# Patient Record
Sex: Female | Born: 1970 | Race: White | Hispanic: No | Marital: Married | Smoking: Former smoker
Health system: Southern US, Community
[De-identification: ages and names within clinical notes are randomized; demographics above are authoritative.]

## PROBLEM LIST (undated history)

## (undated) DIAGNOSIS — J449 Chronic obstructive pulmonary disease, unspecified: Secondary | ICD-10-CM

## (undated) HISTORY — DX: Chronic obstructive pulmonary disease, unspecified: J44.9

## (undated) HISTORY — PX: HERNIA REPAIR: SHX51

---

## 1993-02-19 HISTORY — PX: BREAST EXCISIONAL BIOPSY: SUR124

## 2004-11-09 ENCOUNTER — Inpatient Hospital Stay: Payer: Self-pay | Admitting: Unknown Physician Specialty

## 2005-05-22 ENCOUNTER — Ambulatory Visit: Payer: Self-pay | Admitting: General Surgery

## 2006-04-16 ENCOUNTER — Ambulatory Visit: Payer: Self-pay

## 2006-07-23 ENCOUNTER — Inpatient Hospital Stay: Payer: Self-pay

## 2006-11-19 ENCOUNTER — Ambulatory Visit: Payer: Self-pay | Admitting: Internal Medicine

## 2008-08-09 ENCOUNTER — Ambulatory Visit: Payer: Self-pay | Admitting: General Surgery

## 2008-08-13 ENCOUNTER — Ambulatory Visit: Payer: Self-pay | Admitting: General Surgery

## 2009-06-21 ENCOUNTER — Ambulatory Visit: Payer: Self-pay | Admitting: Internal Medicine

## 2010-10-17 ENCOUNTER — Ambulatory Visit: Payer: Self-pay | Admitting: Obstetrics and Gynecology

## 2011-01-22 ENCOUNTER — Telehealth: Payer: Self-pay | Admitting: *Deleted

## 2011-01-22 NOTE — Telephone Encounter (Signed)
Pls confirm that this patient has been haven an appt since a note was generated. When she gets here.  Sh e will need an EKG

## 2011-01-22 NOTE — Telephone Encounter (Signed)
Yes, patient is aware of the appt.

## 2011-01-22 NOTE — Telephone Encounter (Signed)
I spoke w/pt - she c/o left sided chest pressure and ache, both mild. Area of pressure & discomfort are not external. Symptoms have been off and on x 4 wks but now more consistent x 1 wk. No nausea, SOB, left arm or jaw pain. Symptoms are NOT worse w/exertion. She reports no history of of heart disease/problems only history of anxiety. She is getting concerned b/c klonopin given for anxiety has not helped w/symptoms.   Dr Darrick Huntsman - would you consider working patient in soon for eval? (she was advised to go to ER w/change in symptoms)

## 2011-01-23 ENCOUNTER — Ambulatory Visit (INDEPENDENT_AMBULATORY_CARE_PROVIDER_SITE_OTHER): Payer: BC Managed Care – PPO | Admitting: Internal Medicine

## 2011-01-23 ENCOUNTER — Ambulatory Visit: Payer: Self-pay | Admitting: Internal Medicine

## 2011-01-23 ENCOUNTER — Encounter: Payer: Self-pay | Admitting: Internal Medicine

## 2011-01-23 DIAGNOSIS — R079 Chest pain, unspecified: Secondary | ICD-10-CM

## 2011-01-23 MED ORDER — HYOSCYAMINE SULFATE 0.125 MG SL SUBL: 0.1250 mg | SUBLINGUAL_TABLET | SUBLINGUAL | Status: AC | PRN

## 2011-01-23 MED ORDER — ESOMEPRAZOLE MAGNESIUM 40 MG PO CPDR: 40.0000 mg | DELAYED_RELEASE_CAPSULE | Freq: Every day | ORAL | Status: DC

## 2011-01-23 MED ORDER — ALPRAZOLAM 0.5 MG PO TABS: 0.5000 mg | ORAL_TABLET | Freq: Three times a day (TID) | ORAL | Status: AC | PRN

## 2011-01-23 NOTE — Patient Instructions (Signed)
We are going to treat your for anxiety and esophagitis with several medications:  For the esophagus we will start Nexium on tablet daily (replaces zantac)  For the next episode of chest tightness,  dissolve a hyoscyamine under your tongue, and if no relief in 15 minutes,  Take an alprazolam ( for panic,  Quicker acting than clonazepam)  Let's get a chest x ray tonight  If symptoms are not at all improved with medications,  I recommend getting a  Holter monitor to check your heart rhythm

## 2011-01-23 NOTE — Progress Notes (Signed)
  Subjective:    Patient ID: Virginia Maldonado, female    DOB: 01-13-1971, 40 y.o.   MRN: [id number]  HPI  40 yo healthy female with a history of GERDpresents with a one month history of left sided chest pain which has been increasing in frequency over the past week.  She takes zantac once daily , lately bid, and has been having episodes of tachycardia and presyncope lasting several minutes which have occured both at rest and with minimal activity.  Past Medical History  Diagnosis Date  . COPD (chronic obstructive pulmonary disease)     No current outpatient prescriptions on file prior to visit.   Review of Systems  Constitutional: Negative for fever, chills and unexpected weight change.  HENT: Negative for hearing loss, ear pain, nosebleeds, congestion, sore throat, facial swelling, rhinorrhea, sneezing, mouth sores, trouble swallowing, neck pain, neck stiffness, voice change, postnasal drip, sinus pressure, tinnitus and ear discharge.   Eyes: Negative for pain, discharge, redness and visual disturbance.  Respiratory: Negative for cough, chest tightness, shortness of breath, wheezing and stridor.   Cardiovascular: Negative for chest pain, palpitations and leg swelling.  Musculoskeletal: Negative for myalgias and arthralgias.  Skin: Negative for color change and rash.  Neurological: Negative for dizziness, weakness, light-headedness and headaches.  Hematological: Negative for adenopathy.   BP 98/62  Pulse 66  Temp(Src) 98.4 F (36.9 C) (Oral)  Resp 14  Ht 5\' 6"  (1.676 m)  Wt 130 lb (58.968 kg)  BMI 20.98 kg/m2  SpO2 100%  LMP 01/11/2011     Objective:   Physical Exam  Constitutional: She is oriented to person, place, and time. She appears well-developed and well-nourished.  HENT:  Mouth/Throat: Oropharynx is clear and moist.  Eyes: EOM are normal. Pupils are equal, round, and reactive to light. No scleral icterus.  Neck: Normal range of motion. Neck supple. No JVD present. No  thyromegaly present.  Cardiovascular: Normal rate, regular rhythm, normal heart sounds and intact distal pulses.   Pulmonary/Chest: Effort normal and breath sounds normal.  Abdominal: Soft. Bowel sounds are normal. She exhibits no mass. There is no tenderness.  Musculoskeletal: Normal range of motion. She exhibits no edema.  Lymphadenopathy:    She has no cervical adenopathy.  Neurological: She is alert and oriented to person, place, and time.  Skin: Skin is warm and dry.  Psychiatric: She has a normal mood and affect.       Assessment & Plan:  Chest pain:  Given the concurrent tachycardia and presyncope, normal EKG, etiology may be panic attack, SVT, or reflux.  Will treat for reflux and anxiety and if no improvement refer to [facility] for Holter monitor

## 2011-01-25 ENCOUNTER — Telehealth: Payer: Self-pay | Admitting: Internal Medicine

## 2011-01-25 NOTE — Telephone Encounter (Signed)
Her chest x ray is normal. Has she had any relief using the medications I prescribed?

## 2011-01-26 NOTE — Telephone Encounter (Signed)
Left message asking patient to return my call.

## 2011-01-26 NOTE — Telephone Encounter (Signed)
Patient returned call and was notified. She says that she is getting some relief, but not 100%. She says that she will call us back if not feeling completely better in a couple of weeks.

## 2011-02-14 ENCOUNTER — Encounter: Payer: Self-pay | Admitting: Internal Medicine

## 2011-02-27 ENCOUNTER — Telehealth: Payer: Self-pay | Admitting: Internal Medicine

## 2011-02-27 DIAGNOSIS — K219 Gastro-esophageal reflux disease without esophagitis: Secondary | ICD-10-CM

## 2011-02-27 MED ORDER — PANTOPRAZOLE SODIUM 40 MG PO TBEC: 40.0000 mg | DELAYED_RELEASE_TABLET | Freq: Every day | ORAL | Status: DC

## 2011-02-27 NOTE — Telephone Encounter (Signed)
Advised pt

## 2011-02-27 NOTE — Telephone Encounter (Signed)
Pt states she had a rash from head to toe while taking nexium- took this for about 2 weeks.  She had a similar reaction when taking prilosec- had whelps then.  She wants to try something else, not zantac because that doesn't help her.  Uses [facility].

## 2011-02-27 NOTE — Telephone Encounter (Signed)
rx for protonix sent to [facility]

## 2011-06-20 ENCOUNTER — Other Ambulatory Visit: Payer: Self-pay | Admitting: Internal Medicine

## 2011-06-20 DIAGNOSIS — K219 Gastro-esophageal reflux disease without esophagitis: Secondary | ICD-10-CM

## 2011-06-20 MED ORDER — PANTOPRAZOLE SODIUM 40 MG PO TBEC: 40.0000 mg | DELAYED_RELEASE_TABLET | Freq: Every day | ORAL | Status: DC

## 2011-07-20 ENCOUNTER — Other Ambulatory Visit: Payer: Self-pay | Admitting: Internal Medicine

## 2011-11-26 ENCOUNTER — Encounter: Payer: Self-pay | Admitting: Internal Medicine

## 2012-08-12 ENCOUNTER — Ambulatory Visit (INDEPENDENT_AMBULATORY_CARE_PROVIDER_SITE_OTHER): Payer: BC Managed Care – PPO | Admitting: Adult Health

## 2012-08-12 ENCOUNTER — Encounter: Payer: Self-pay | Admitting: Adult Health

## 2012-08-12 ENCOUNTER — Ambulatory Visit (INDEPENDENT_AMBULATORY_CARE_PROVIDER_SITE_OTHER)
Admission: RE | Admit: 2012-08-12 | Discharge: 2012-08-12 | Disposition: A | Discharge status: Home / Self Care | Payer: BC Managed Care – PPO | Source: Ambulatory Visit | Attending: Adult Health | Admitting: Adult Health

## 2012-08-12 VITALS — BP 100/72 | HR 60 | Resp 12 | Wt 134.5 lb

## 2012-08-12 DIAGNOSIS — R079 Chest pain, unspecified: Secondary | ICD-10-CM

## 2012-08-12 DIAGNOSIS — R0789 Other chest pain: Secondary | ICD-10-CM | POA: Insufficient documentation

## 2012-08-12 LAB — BASIC METABOLIC PANEL
BUN: 13 mg/dL (ref 6–23)
Calcium: 9.9 mg/dL (ref 8.4–10.5)
GFR: 74.92 mL/min (ref >60.00)
Potassium: 4.4 mEq/L (ref 3.5–5.1)
Sodium: 138 mEq/L (ref 135–145)

## 2012-08-12 LAB — CBC WITH DIFFERENTIAL/PLATELET
Basophils Absolute: 0 10*3/uL (ref 0.0–0.1)
Eosinophils Relative: 2.5 % (ref 0.0–5.0)
HCT: 37.3 % (ref 36.0–46.0)
Lymphocytes Relative: 27 % (ref 12.0–46.0)
Lymphs Abs: 2.1 10*3/uL (ref 0.7–4.0)
Monocytes Relative: 7.7 % (ref 3.0–12.0)
Platelets: 226 10*3/uL (ref 150.0–400.0)
WBC: 7.6 10*3/uL (ref 4.5–10.5)

## 2012-08-12 LAB — TROPONIN I: Troponin I: <0.01 ng/mL (ref <0.06)

## 2012-08-12 LAB — D-DIMER, QUANTITATIVE: D-Dimer, Quant: 0.27 ug/mL-FEU (ref 0.00–0.48)

## 2012-08-12 NOTE — Patient Instructions (Addendum)
  Please have your blood work drawn prior to leaving the office.  Please go to our [facility] for your chest xray.  Once the results are available we will contact you with further instructions.  Take Zantac 150 mg twice daily.

## 2012-08-12 NOTE — Progress Notes (Signed)
  Subjective:    Patient ID: Virginia Maldonado, female    DOB: 1970/06/18, 42 y.o.   MRN: [id number]  HPI  Patient is a pleasant 42 year old female who presents to clinic with complaints of heavy feeling in her chest only on the left side. She describes the pain as constant and dull. She wakes up every night and then stays up for hours secondary to this pain. She reports that mainly she states up because she becomes very anxious thinking about her pain. The pain does not occur with exercise. She reports very good stamina. Does pushups without this bothering her. She feels she is not getting the same amount of breath on the left side. She feels pressure on the left side when inhaling. Note patient was seen approximately 2 years ago with symptoms of chest pain suspected to be anxiety related.  Pt is experiencing increasing anxiety. It has increased to the point that she [place] not [place]. She has been battling anxiety since grad school back in 1998. She has tried Zoloft but she broke out in a rash. She has tried a small amount of klonopin but did not think it helped so she is not taking this. Has a 104 y/o son who was diagnosed with autism. She has been getting multiple calls from school about his behavior. She feels this is causing increased anxiety.  Remote tobacco use for approximately 6 years ~ 1/2 ppd. She also lived with smokers for several years. Pt exposed to second hand.    Review of Systems  Constitutional: Negative.   HENT: Negative.   Respiratory: Positive for chest tightness. Negative for cough, shortness of breath and wheezing.   Cardiovascular: Positive for chest pain. Negative for palpitations and leg swelling.  Gastrointestinal: Negative.   Genitourinary: Negative.   Musculoskeletal: Negative.   Skin: Negative.   Neurological: Negative.   Psychiatric/Behavioral: The patient is nervous/anxious.     BP 100/72  Pulse 60  Resp 12  Wt 134 lb 8 oz (61.009 kg)  BMI 21.72 kg/m2   SpO2 98%  LMP 07/27/2012    Objective:   Physical Exam  Constitutional: She is oriented to person, place, and time. She appears well-developed and well-nourished. No distress.  HENT:  Head: Normocephalic and atraumatic.  Eyes: Conjunctivae are normal. Pupils are equal, round, and reactive to light.  Neck: Normal range of motion. Neck supple.  Cardiovascular: Normal rate, regular rhythm, normal heart sounds and intact distal pulses.  Exam reveals no gallop and no friction rub.   No murmur heard. Pulmonary/Chest: Effort normal and breath sounds normal. No respiratory distress. She has no wheezes. She has no rales.  Abdominal: Soft. Bowel sounds are normal.  Musculoskeletal: Normal range of motion. She exhibits no edema.  Lymphadenopathy:    She has no cervical adenopathy.  Neurological: She is alert and oriented to person, place, and time.  Skin: Skin is warm and dry.  Psychiatric: She has a normal mood and affect. Her behavior is normal. Judgment and thought content normal.       Assessment & Plan:

## 2012-08-12 NOTE — Assessment & Plan Note (Addendum)
Atypical presentation in patient with history of anxiety. EKG without abnormality. Chest x-ray. Check labs: crp, troponin, metabolic panel, CBC and d-dimer. Discussed with patient about suspicion of this being related to her anxiety. If all test results are negative consider starting her on antidepressant to help prevent anxiety. She has been on Zoloft in the past; however, she developed hives from this. We'll try other medication. Note greater than 25 minutes were spent in face-to-face communication with patient in the assessment, education, planning and implementation pertaining to this problem.

## 2012-08-13 ENCOUNTER — Other Ambulatory Visit: Payer: Self-pay | Admitting: Adult Health

## 2012-08-13 DIAGNOSIS — D539 Nutritional anemia, unspecified: Secondary | ICD-10-CM

## 2012-08-19 ENCOUNTER — Other Ambulatory Visit (INDEPENDENT_AMBULATORY_CARE_PROVIDER_SITE_OTHER): Payer: BC Managed Care – PPO

## 2012-08-19 DIAGNOSIS — D539 Nutritional anemia, unspecified: Secondary | ICD-10-CM

## 2012-08-19 LAB — TSH: TSH: 1.81 u[IU]/mL (ref 0.35–5.50)

## 2012-08-19 LAB — FOLATE: Folate: 23.8 ng/mL (ref >5.9)

## 2012-12-25 ENCOUNTER — Other Ambulatory Visit: Payer: Self-pay

## 2013-03-20 ENCOUNTER — Ambulatory Visit (INDEPENDENT_AMBULATORY_CARE_PROVIDER_SITE_OTHER): Payer: BC Managed Care – PPO | Admitting: Internal Medicine

## 2013-03-20 ENCOUNTER — Encounter: Payer: Self-pay | Admitting: Internal Medicine

## 2013-03-20 VITALS — BP 104/70 | HR 80 | Temp 98.1°F | Resp 18 | Wt 142.0 lb

## 2013-03-20 DIAGNOSIS — J069 Acute upper respiratory infection, unspecified: Secondary | ICD-10-CM

## 2013-03-20 DIAGNOSIS — R918 Other nonspecific abnormal finding of lung field: Secondary | ICD-10-CM

## 2013-03-20 DIAGNOSIS — R9389 Abnormal findings on diagnostic imaging of other specified body structures: Secondary | ICD-10-CM

## 2013-03-20 MED ORDER — AMOXICILLIN 500 MG PO CAPS: 500.0000 mg | ORAL_CAPSULE | Freq: Three times a day (TID) | ORAL | Status: DC

## 2013-03-20 MED ORDER — PREDNISONE (PAK) 10 MG PO TABS: ORAL_TABLET | ORAL | Status: DC

## 2013-03-20 MED ORDER — FLUCONAZOLE 150 MG PO TABS: 150.0000 mg | ORAL_TABLET | Freq: Every day | ORAL | Status: DC

## 2013-03-20 MED ORDER — ALBUTEROL SULFATE HFA 108 (90 BASE) MCG/ACT IN AERS: 2.0000 | INHALATION_SPRAY | Freq: Four times a day (QID) | RESPIRATORY_TRACT | Status: DC | PRN

## 2013-03-20 MED ORDER — LORATADINE 10 MG PO TABS: 10.0000 mg | ORAL_TABLET | Freq: Every day | ORAL | Status: DC

## 2013-03-20 NOTE — Progress Notes (Signed)
Pre-visit discussion using our clinic review tool. No additional management support is needed unless otherwise documented below in the visit note.  

## 2013-03-20 NOTE — Progress Notes (Signed)
Patient ID: Virginia Maldonado, female   DOB: 01/25/1971, 43 y.o.   MRN: [id number]   .  Patient Active Problem List   Diagnosis Date Noted  . Acute upper respiratory infections of unspecified site 03/22/2013  . Abnormal finding on chest xray 03/22/2013  . Chest pain 08/12/2012    Subjective:  CC:   Chief Complaint  Patient presents with  . Fatigue  . Headache  . Weight Gain    HPI:   Virginia Maldonado a 43 y.o. female who presents Fatigue since Monday , headache since Monday.  She has been using advil,  and over-the-counter decongestants. She has noted Some nasal drainage on right,  Two kids with Strep at home.    Had a chest x ray in June that showed hyperinflation.  She did not followup for evaluation for possible asthma. She has noted that she does not typically run during her workouts because she has noted excessive dyspnea. She is no history of tobacco abuse but did grow up in the household   Past Medical History  Diagnosis Date  . COPD (chronic obstructive pulmonary disease)     History reviewed. No pertinent past surgical history.     The following portions of the patient's history were reviewed and updated as appropriate: Allergies, current medications, and problem list.    Review of Systems:   12 Pt  review of systems was negative except those addressed in the HPI,     History   Social History  . Marital Status: Married    Spouse Name: N/A    Number of Children: N/A  . Years of Education: N/A   Occupational History  . Not on file.   Social History Main Topics  . Smoking status: Former Smoker    Types: Cigarettes    Quit date: 01/23/1996  . Smokeless tobacco: Never Used  . Alcohol Use: Yes  . Drug Use: No  . Sexual Activity: Not on file   Other Topics Concern  . Not on file   Social History Narrative  . No narrative on file    Objective:  Filed Vitals:   03/20/13 1532  BP: 104/70  Pulse: 80  Temp: 98.1 F (36.7 C)  Resp: 18     General  appearance: alert, cooperative and appears stated age Ears: normal TM's and external ear canals both ears Throat: lips, mucosa, and tongue normal; teeth and gums normal Neck: no adenopathy, no carotid bruit, supple, symmetrical, trachea midline and thyroid not enlarged, symmetric, no tenderness/mass/nodules Back: symmetric, no curvature. ROM normal. No CVA tenderness. Lungs: clear to auscultation bilaterally Heart: regular rate and rhythm, S1, S2 normal, no murmur, click, rub or gallop Abdomen: soft, non-tender; bowel sounds normal; no masses,  no organomegaly Pulses: 2+ and symmetric Skin: Skin color, texture, turgor normal. No rashes or lesions Lymph nodes: Cervical, supraclavicular, and axillary nodes normal.  Assessment and Plan:  Acute upper respiratory infections of unspecified site She has no signs or symptoms of strep throat despite her children being diagnosed with such.Symptoms of  URI are caused by viral infection currently given her current symptoms.   I have explained that in viral URIS, an antibiotic will not help the symptoms and will increase the risk of developing diarrhea. Advised to use oral and nasal decongestants,  Ibuprofen 400 mg and tylenol 650 mq 8 hrs for aches and pains,  tessalon every 8 hours prn cough  Advised to start round of abx only if symptoms worsen to include fevers, facial  pain, purulent sputum./drainage.    Abnormal finding on chest xray She was noted to have hyperinflated lungs on chest x-ray done several months ago. She has noted exertional dyspnea prevents her from using running as a modality for exercise. We discussed the fact that she may have exertional asthma. I have given her a sample inhaler to use prior to exercise to see if this improves her endurance. If it does not we will recommend pulmonary function tests.    Updated Medication List Outpatient Encounter Prescriptions as of 03/20/2013  Medication Sig  . ibuprofen (ADVIL) 200 MG tablet Take  600 mg by mouth every 6 (six) hours as needed.  . Methylcellulose, Laxative, (CITRUCEL PO) Take by mouth daily.  . ranitidine (ZANTAC) 150 MG tablet Take 150 mg by mouth daily.  Marland Kitchen albuterol (PROVENTIL HFA;VENTOLIN HFA) 108 (90 BASE) MCG/ACT inhaler Inhale 2 puffs into the lungs every 6 (six) hours as needed for wheezing or shortness of breath.  Marland Kitchen amoxicillin (AMOXIL) 500 MG capsule Take 1 capsule (500 mg total) by mouth 3 (three) times daily.  . fluconazole (DIFLUCAN) 150 MG tablet Take 1 tablet (150 mg total) by mouth daily.  Marland Kitchen loratadine (CLARITIN) 10 MG tablet Take 1 tablet (10 mg total) by mouth daily.  . predniSONE (STERAPRED UNI-PAK) 10 MG tablet 6 tablets on Day 1 , then reduce by 1 tablet daily until gone     No orders of the defined types were placed in this encounter.    No Follow-up on file.

## 2013-03-20 NOTE — Patient Instructions (Signed)
You have a viral  Syndrome .  The post nasal drip is causing your sore throat. Your headaches may be due to sinus congestion   Lavage your sinuses twice daily with Simply saline nasal spray.  Use benadryl 25 mg every 8 hours and Sudafed PE 10 to 30mg   every 6 hours(NOT AFTER 3 PM)  to manage the drainage and congestion.  Gargle with salt water as needed for the sore throat.  Prednisone taper to help manage the headache and inflammation  If the throat is no better  In 3 to 4 days OR  if you develop fever ( T > 100.4,)  Green nasal discharge,  Ear Or facial pain,  Start the antibiotic. Please take a probiotic ( Align, Floraque or Culturelle) for 2 weeks if you start the antibiotic to prevent a serious antibiotic associated diarrhea  Called" clostridium dificile colitis" ( should also help prevent   vaginal yeast infection, but you have an rx for fluconazole just in case )

## 2013-03-22 ENCOUNTER — Encounter: Payer: Self-pay | Admitting: Internal Medicine

## 2013-03-22 DIAGNOSIS — J069 Acute upper respiratory infection, unspecified: Secondary | ICD-10-CM | POA: Insufficient documentation

## 2013-03-22 DIAGNOSIS — R9389 Abnormal findings on diagnostic imaging of other specified body structures: Secondary | ICD-10-CM | POA: Insufficient documentation

## 2013-03-22 NOTE — Assessment & Plan Note (Signed)
She was noted to have hyperinflated lungs on chest x-ray done several months ago. She has noted exertional dyspnea prevents her from using running as a modality for exercise. We discussed the fact that she may have exertional asthma. I have given her a sample inhaler to use prior to exercise to see if this improves her endurance. If it does not we will recommend pulmonary function tests.

## 2013-03-22 NOTE — Assessment & Plan Note (Signed)
She has no signs or symptoms of strep throat despite her children being diagnosed with such.Symptoms of  URI are caused by viral infection currently given her current symptoms.   I have explained that in viral URIS, an antibiotic will not help the symptoms and will increase the risk of developing diarrhea. Advised to use oral and nasal decongestants,  Ibuprofen 400 mg and tylenol 650 mq 8 hrs for aches and pains,  tessalon every 8 hours prn cough  Advised to start round of abx only if symptoms worsen to include fevers, facial pain, purulent sputum./drainage.

## 2013-05-08 ENCOUNTER — Encounter: Payer: BC Managed Care – PPO | Admitting: Internal Medicine

## 2013-05-19 ENCOUNTER — Encounter: Payer: BC Managed Care – PPO | Admitting: Internal Medicine

## 2013-06-01 ENCOUNTER — Other Ambulatory Visit (HOSPITAL_COMMUNITY)
Admission: RE | Admit: 2013-06-01 | Discharge: 2013-06-01 | Disposition: A | Discharge status: Home / Self Care | Payer: BC Managed Care – PPO | Source: Ambulatory Visit | Attending: Internal Medicine | Admitting: Internal Medicine

## 2013-06-01 ENCOUNTER — Ambulatory Visit (INDEPENDENT_AMBULATORY_CARE_PROVIDER_SITE_OTHER): Payer: BC Managed Care – PPO | Admitting: Internal Medicine

## 2013-06-01 ENCOUNTER — Encounter: Payer: Self-pay | Admitting: Emergency Medicine

## 2013-06-01 ENCOUNTER — Encounter: Payer: Self-pay | Admitting: Internal Medicine

## 2013-06-01 VITALS — BP 106/74 | HR 70 | Temp 98.1°F | Resp 18 | Ht 66.0 in | Wt 143.8 lb

## 2013-06-01 DIAGNOSIS — Z124 Encounter for screening for malignant neoplasm of cervix: Secondary | ICD-10-CM

## 2013-06-01 DIAGNOSIS — K589 Irritable bowel syndrome without diarrhea: Secondary | ICD-10-CM

## 2013-06-01 DIAGNOSIS — Z1239 Encounter for other screening for malignant neoplasm of breast: Secondary | ICD-10-CM

## 2013-06-01 DIAGNOSIS — R5381 Other malaise: Secondary | ICD-10-CM

## 2013-06-01 DIAGNOSIS — Z13220 Encounter for screening for lipoid disorders: Secondary | ICD-10-CM

## 2013-06-01 DIAGNOSIS — J309 Allergic rhinitis, unspecified: Secondary | ICD-10-CM

## 2013-06-01 DIAGNOSIS — Z0000 Encounter for general adult medical examination without abnormal findings: Secondary | ICD-10-CM

## 2013-06-01 DIAGNOSIS — R5383 Other fatigue: Secondary | ICD-10-CM

## 2013-06-01 DIAGNOSIS — J069 Acute upper respiratory infection, unspecified: Secondary | ICD-10-CM

## 2013-06-01 DIAGNOSIS — Z1151 Encounter for screening for human papillomavirus (HPV): Secondary | ICD-10-CM | POA: Insufficient documentation

## 2013-06-01 LAB — COMPREHENSIVE METABOLIC PANEL
ALT: 13 U/L (ref 0–35)
AST: 20 U/L (ref 0–37)
Albumin: 4.4 g/dL (ref 3.5–5.2)
Alkaline Phosphatase: 43 U/L (ref 39–117)
BILIRUBIN TOTAL: 0.5 mg/dL (ref 0.3–1.2)
BUN: 13 mg/dL (ref 6–23)
CO2: 25 meq/L (ref 19–32)
CREATININE: 0.9 mg/dL (ref 0.4–1.2)
Calcium: 9.1 mg/dL (ref 8.4–10.5)
Chloride: 105 mEq/L (ref 96–112)
GFR: 76.64 mL/min (ref >60.00)
GLUCOSE: 82 mg/dL (ref 70–99)
Potassium: 4.3 mEq/L (ref 3.5–5.1)
SODIUM: 139 meq/L (ref 135–145)
TOTAL PROTEIN: 7.4 g/dL (ref 6.0–8.3)

## 2013-06-01 LAB — CBC WITH DIFFERENTIAL/PLATELET
BASOS PCT: 0.6 % (ref 0.0–3.0)
Basophils Absolute: 0 10*3/uL (ref 0.0–0.1)
EOS PCT: 5.4 % — AB (ref 0.0–5.0)
Eosinophils Absolute: 0.4 10*3/uL (ref 0.0–0.7)
HEMATOCRIT: 36.6 % (ref 36.0–46.0)
Hemoglobin: 12.4 g/dL (ref 12.0–15.0)
LYMPHS ABS: 1.6 10*3/uL (ref 0.7–4.0)
Lymphocytes Relative: 22.8 % (ref 12.0–46.0)
MCHC: 33.8 g/dL (ref 30.0–36.0)
MCV: 93.5 fl (ref 78.0–100.0)
MONOS PCT: 6.2 % (ref 3.0–12.0)
Monocytes Absolute: 0.4 10*3/uL (ref 0.1–1.0)
Neutro Abs: 4.6 10*3/uL (ref 1.4–7.7)
Neutrophils Relative %: 65 % (ref 43.0–77.0)
PLATELETS: 227 10*3/uL (ref 150.0–400.0)
RBC: 3.91 Mil/uL (ref 3.87–5.11)
RDW: 13.1 % (ref 11.5–14.6)
WBC: 7 10*3/uL (ref 4.5–10.5)

## 2013-06-01 LAB — LIPID PANEL
CHOLESTEROL: 211 mg/dL — AB (ref 0–200)
HDL: 52 mg/dL (ref >39.00)
LDL CALC: 132 mg/dL — AB (ref 0–99)
Total CHOL/HDL Ratio: 4
Triglycerides: 135 mg/dL (ref 0.0–149.0)
VLDL: 27 mg/dL (ref 0.0–40.0)

## 2013-06-01 LAB — TSH: TSH: 2 u[IU]/mL (ref 0.35–5.50)

## 2013-06-01 MED ORDER — FEXOFENADINE-PSEUDOEPHED ER 180-240 MG PO TB24: 1.0000 | ORAL_TABLET | Freq: Every day | ORAL | Status: DC

## 2013-06-01 MED ORDER — DICYCLOMINE HCL 20 MG PO TABS: 20.0000 mg | ORAL_TABLET | Freq: Four times a day (QID) | ORAL | Status: DC

## 2013-06-01 MED ORDER — MONTELUKAST SODIUM 10 MG PO TABS: 10.0000 mg | ORAL_TABLET | Freq: Every day | ORAL | Status: DC

## 2013-06-01 NOTE — Progress Notes (Signed)
Patient ID: Virginia Maldonado, female   DOB: February 06, 1971, 43 y.o.   MRN: [id number]   Subjective:     Virginia Maldonado is a 43 y.o. female and is here for a comprehensive physical exam. The patient reports problems - as listed below.  History   Social History  . Marital Status: Married    Spouse Name: N/A    Number of Children: N/A  . Years of Education: N/A   Occupational History  . Not on file.   Social History Main Topics  . Smoking status: Former Smoker    Types: Cigarettes    Quit date: 01/23/1996  . Smokeless tobacco: Never Used  . Alcohol Use: Yes  . Drug Use: No  . Sexual Activity: Not on file   Other Topics Concern  . Not on file   Social History Narrative  . No narrative on file   Health Maintenance  Topic Date Due  . Pap Smear  09/03/1988  . Tetanus/tdap  09/03/1989  . Influenza Vaccine  09/19/2013    The following portions of the patient's history were reviewed and updated as appropriate: allergies, current medications, past family history, past medical history, past social history, past surgical history and problem list.  Review of Systems A comprehensive review of systems was negative.   Objective:   BP 106/74  Pulse 70  Temp(Src) 98.1 F (36.7 C) (Oral)  Resp 18  Ht 5\' 6"  (1.676 m)  Wt 143 lb 12 oz (65.205 kg)  BMI 23.21 kg/m2  SpO2 99%  LMP 05/29/2013   General Appearance:    Alert, cooperative, no distress, appears stated age  Head:    Normocephalic, without obvious abnormality, atraumatic  Eyes:    PERRL, conjunctiva/corneas clear, EOM's intact, fundi    benign, both eyes  Ears:    Normal TM's and external ear canals, both ears  Nose:   Nares normal, septum midline, mucosa normal, no drainage    or sinus tenderness  Throat:   Lips, mucosa, and tongue normal; teeth and gums normal  Neck:   Supple, symmetrical, trachea midline, no adenopathy;    thyroid:  no enlargement/tenderness/nodules; no carotid   bruit or JVD  Back:     Symmetric, no  curvature, ROM normal, no CVA tenderness  Lungs:     Clear to auscultation bilaterally, respirations unlabored  Chest Wall:    No tenderness or deformity   Heart:    Regular rate and rhythm, S1 and S2 normal, no murmur, rub   or gallop  Breast Exam:    No tenderness, masses, or nipple abnormality  Abdomen:     Soft, non-tender, bowel sounds active all four quadrants,    no masses, no organomegaly  Genitalia:    Pelvic: cervix difficult to visualize due to narrow long vault and menstrual blood  external genitalia normal, no adnexal masses or tenderness, no cervical motion tenderness, rectovaginal septum normal, uterus normal size, shape, and consistency and vagina normal without discharge  Extremities:   Extremities normal, atraumatic, no cyanosis or edema  Pulses:   2+ and symmetric all extremities  Skin:   Skin color, texture, turgor normal, no rashes or lesions  Lymph nodes:   Cervical, supraclavicular, and axillary nodes normal  Neurologic:   CNII-XII intact, normal strength, sensation and reflexes    throughout     Assessment and Plan:   Allergic rhinitis with eosinophilia on CBc. Adding singulair and prednisone taper to current regimen    Encounter for preventive health examination  Annual comprehensive exam was done including breast, pelvic and PAP smear. All screenings have been addressed .   Irritable bowel syndrome (IBS) With multiple food intolerances and normal EGD previously reported for evaluation of celiac srue.  Trial of  bentyl.    Updated Medication List Outpatient Encounter Prescriptions as of 06/01/2013  Medication Sig  . albuterol (PROVENTIL HFA;VENTOLIN HFA) 108 (90 BASE) MCG/ACT inhaler Inhale 2 puffs into the lungs every 6 (six) hours as needed for wheezing or shortness of breath.  Marland Kitchen. ibuprofen (ADVIL) 200 MG tablet Take 600 mg by mouth every 6 (six) hours as needed.  . Methylcellulose, Laxative, (CITRUCEL PO) Take by mouth daily.  . ranitidine (ZANTAC) 150 MG  tablet Take 150 mg by mouth daily.  . [DISCONTINUED] loratadine (CLARITIN) 10 MG tablet Take 1 tablet (10 mg total) by mouth daily.  Marland Kitchen. dicyclomine (BENTYL) 20 MG tablet Take 1 tablet (20 mg total) by mouth every 6 (six) hours.  . fexofenadine-pseudoephedrine (ALLEGRA-D ALLERGY & CONGESTION) 180-240 MG per 24 hr tablet Take 1 tablet by mouth daily.  . montelukast (SINGULAIR) 10 MG tablet Take 1 tablet (10 mg total) by mouth at bedtime.  . predniSONE (STERAPRED UNI-PAK) 10 MG tablet 6 tablets on Day 1 , then reduce by 1 tablet daily until gone  . [DISCONTINUED] amoxicillin (AMOXIL) 500 MG capsule Take 1 capsule (500 mg total) by mouth 3 (three) times daily.  . [DISCONTINUED] fluconazole (DIFLUCAN) 150 MG tablet Take 1 tablet (150 mg total) by mouth daily.  . [DISCONTINUED] predniSONE (STERAPRED UNI-PAK) 10 MG tablet 6 tablets on Day 1 , then reduce by 1 tablet daily until gone

## 2013-06-01 NOTE — Patient Instructions (Addendum)
Allegra 180 mg daily is worth trying instead of claritin for your allergies  Adding singulair 10 mg daily   We will schedule your mammogram in the next few weeks   We will call you with the results of the PAP smear   Trial of dicyclomine, an antispasmodic for your GI Tract.  tark 20 to 30 mintues prior to meals

## 2013-06-01 NOTE — Progress Notes (Signed)
Pre-visit discussion using our clinic review tool. No additional management support is needed unless otherwise documented below in the visit note.  

## 2013-06-02 ENCOUNTER — Encounter: Payer: Self-pay | Admitting: Internal Medicine

## 2013-06-02 DIAGNOSIS — J309 Allergic rhinitis, unspecified: Secondary | ICD-10-CM | POA: Insufficient documentation

## 2013-06-02 DIAGNOSIS — Z0000 Encounter for general adult medical examination without abnormal findings: Secondary | ICD-10-CM | POA: Insufficient documentation

## 2013-06-02 DIAGNOSIS — K589 Irritable bowel syndrome without diarrhea: Secondary | ICD-10-CM | POA: Insufficient documentation

## 2013-06-02 MED ORDER — PREDNISONE (PAK) 10 MG PO TABS: ORAL_TABLET | ORAL | Status: DC

## 2013-06-02 NOTE — Assessment & Plan Note (Signed)
With multiple food intolerances and normal EGD previously reported for evaluation of celiac srue.  Trial of  bentyl.

## 2013-06-02 NOTE — Addendum Note (Signed)
Addended by: Sherlene ShamsULLO, Raffaele Derise L on: 06/02/2013 08:22 AM   Modules accepted: Level of Service

## 2013-06-02 NOTE — Assessment & Plan Note (Signed)
with eosinophilia on CBc. Adding singulair and prednisone taper to current regimen

## 2013-06-02 NOTE — Assessment & Plan Note (Signed)
Annual comprehensive exam was done including breast, pelvic and PAP smear. All screenings have been addressed .  

## 2013-06-04 ENCOUNTER — Encounter: Payer: Self-pay | Admitting: Internal Medicine

## 2013-06-04 DIAGNOSIS — R87610 Atypical squamous cells of undetermined significance on cytologic smear of cervix (ASC-US): Secondary | ICD-10-CM | POA: Insufficient documentation

## 2013-06-17 ENCOUNTER — Ambulatory Visit: Payer: Self-pay | Admitting: Internal Medicine

## 2013-07-22 ENCOUNTER — Encounter: Payer: Self-pay | Admitting: Internal Medicine

## 2015-04-26 ENCOUNTER — Ambulatory Visit (INDEPENDENT_AMBULATORY_CARE_PROVIDER_SITE_OTHER): Payer: BLUE CROSS/BLUE SHIELD | Admitting: Family Medicine

## 2015-04-26 ENCOUNTER — Other Ambulatory Visit: Payer: Self-pay | Admitting: Family Medicine

## 2015-04-26 ENCOUNTER — Encounter: Payer: Self-pay | Admitting: Family Medicine

## 2015-04-26 VITALS — BP 104/68 | HR 68 | Temp 98.2°F | Ht 66.0 in | Wt 146.0 lb

## 2015-04-26 DIAGNOSIS — R300 Dysuria: Secondary | ICD-10-CM | POA: Diagnosis not present

## 2015-04-26 LAB — POCT URINALYSIS DIPSTICK
Bilirubin, UA: NEGATIVE
Glucose, UA: NEGATIVE
Ketones, UA: NEGATIVE
Leukocytes, UA: NEGATIVE
Nitrite, UA: NEGATIVE
PROTEIN UA: NEGATIVE
RBC UA: NEGATIVE
SPEC GRAV UA: 1.015
UROBILINOGEN UA: 0.2
pH, UA: 6

## 2015-04-26 NOTE — Progress Notes (Signed)
Patient ID: Virginia Maldonado, female   DOB: 03/18/1970, 45 y.o.   MRN: [id number]  Virginia AlarEric Sonnenberg, MD Phone: [phone]  Virginia Maldonado is a 45 y.o. female who presents today for same-day visit.  Patient notes onset of dysuria on Saturday. Notes it burns slightly right before she starts to urinate and then burns with urination. Some frequency. Some urgency. No hematuria. No vaginal symptoms. No fevers. She does note some right-sided suprapubic discomfort intermittently, no right lower quadrant pain. She does still have her ovaries, uterus, and appendix. Notes she has a history of UTIs in the distant past. She is sexually active with her husband only. No STD history. Does have a history of yeast infections. No abdominal pain at this time.  PMH: Former smoker. History of UTIs in the past.   ROS see history of present illness  Objective  Physical Exam Filed Vitals:   04/26/15 1122  BP: 104/68  Pulse: 68  Temp: 98.2 F (36.8 C)    BP Readings from Last 3 Encounters:  04/26/15 104/68  06/01/13 106/74  03/20/13 104/70   Wt Readings from Last 3 Encounters:  04/26/15 146 lb (66.225 kg)  06/01/13 143 lb 12 oz (65.205 kg)  03/20/13 142 lb (64.411 kg)    Physical Exam  Constitutional: She is well-developed, well-nourished, and in no distress.  HENT:  Head: Normocephalic and atraumatic.  Cardiovascular: Normal rate, regular rhythm and normal heart sounds.  Exam reveals no gallop and no friction rub.   No murmur heard. Pulmonary/Chest: Effort normal and breath sounds normal. No respiratory distress. She has no wheezes. She has no rales.  Abdominal: Soft. Bowel sounds are normal. She exhibits no distension. There is tenderness (minimal suprapubic tenderness). There is no rebound and no guarding.  No right lower quadrant, right upper quadrant, left lower quadrant, or left upper quadrant tenderness  Genitourinary:  Normal labia, normal vaginal mucosa, minimal white creamy discharge in the  vaginal vault, normal cervix, no discharge from cervix, no cervical motion tenderness, no adnexal tenderness or masses  Musculoskeletal: She exhibits no edema.  Neurological: She is alert. Gait normal.  Skin: Skin is warm and dry. She is not diaphoretic.     Assessment/Plan: Please see individual problem list.  Dysuria Patient with several days of dysuria. UA was unremarkable for infection. Pelvic exam revealed mild white discharge and wet prep was collected. No signs of PID. Minimal suprapubic tenderness on abdominal exam, though no other tenderness on exam. No right lower quadrant tenderness. Suspect likely UTI despite negative UA versus yeast infection versus BV. We'll check urine culture. We'll check wet prep. Patient will continue to monitor. She's given return precautions.    Orders Placed This Encounter  Procedures  . Urine Culture  . WET PREP BY MOLECULAR PROBE  . POCT Urinalysis Dipstick    Virginia AlarEric Sonnenberg, MD [facility]

## 2015-04-26 NOTE — Progress Notes (Signed)
Pre visit review using our clinic review tool, if applicable. No additional management support is needed unless otherwise documented below in the visit note. 

## 2015-04-26 NOTE — Assessment & Plan Note (Signed)
Patient with several days of dysuria. UA was unremarkable for infection. Pelvic exam revealed mild white discharge and wet prep was collected. No signs of PID. Minimal suprapubic tenderness on abdominal exam, though no other tenderness on exam. No right lower quadrant tenderness. Suspect likely UTI despite negative UA versus yeast infection versus BV. We'll check urine culture. We'll check wet prep. Patient will continue to monitor. She's given return precautions.

## 2015-04-26 NOTE — Patient Instructions (Signed)
Nice to meet you. We will send your urine for culture and check for yeast and BV. If you develop worsening abdominal pain, fevers, vaginal discharge, or any new or changing symptoms please seek medical attention.

## 2015-04-28 LAB — WET PREP BY MOLECULAR PROBE
Candida species: POSITIVE — AB
Gardnerella vaginalis: POSITIVE — AB
Trichomonas vaginosis: NEGATIVE

## 2015-04-29 LAB — URINE CULTURE: Colony Count: 40000

## 2015-05-02 ENCOUNTER — Telehealth: Payer: Self-pay | Admitting: Family Medicine

## 2015-05-02 MED ORDER — NITROFURANTOIN MONOHYD MACRO 100 MG PO CAPS: 100.0000 mg | ORAL_CAPSULE | Freq: Two times a day (BID) | ORAL | Status: DC

## 2015-05-02 MED ORDER — FLUCONAZOLE 150 MG PO TABS: 150.0000 mg | ORAL_TABLET | ORAL | Status: DC

## 2015-05-02 MED ORDER — METRONIDAZOLE 500 MG PO TABS
500.0000 mg | ORAL_TABLET | Freq: Two times a day (BID) | ORAL | Status: DC
Start: 2015-05-02 — End: 2016-03-13

## 2015-05-02 NOTE — Telephone Encounter (Signed)
Called and spoke with patient. Advised of lab findings and urine culture findings. Wet prep revealed yeast infection and bacterial vaginosis infection. Urine culture grew Escherichia coli 40,000 colonies. Patient does report she is having same symptoms she was having at her office visit. Given that she is symptomatic and symptoms were urinary in nature I would favor treatment with Diflucan, Flagyl, and Macrobid. She was advised of this. These were sent to her pharmacy.

## 2016-03-13 ENCOUNTER — Ambulatory Visit (INDEPENDENT_AMBULATORY_CARE_PROVIDER_SITE_OTHER): Payer: BLUE CROSS/BLUE SHIELD

## 2016-03-13 ENCOUNTER — Ambulatory Visit
Admission: EM | Admit: 2016-03-13 | Discharge: 2016-03-13 | Disposition: A | Discharge status: Home / Self Care | Payer: BLUE CROSS/BLUE SHIELD | Attending: Family Medicine | Admitting: Family Medicine

## 2016-03-13 ENCOUNTER — Other Ambulatory Visit: Payer: Self-pay

## 2016-03-13 ENCOUNTER — Encounter: Payer: Self-pay | Admitting: Emergency Medicine

## 2016-03-13 ENCOUNTER — Telehealth: Payer: Self-pay | Admitting: Internal Medicine

## 2016-03-13 DIAGNOSIS — R0789 Other chest pain: Secondary | ICD-10-CM

## 2016-03-13 DIAGNOSIS — Z87891 Personal history of nicotine dependence: Secondary | ICD-10-CM | POA: Insufficient documentation

## 2016-03-13 DIAGNOSIS — J449 Chronic obstructive pulmonary disease, unspecified: Secondary | ICD-10-CM | POA: Diagnosis not present

## 2016-03-13 DIAGNOSIS — R079 Chest pain, unspecified: Secondary | ICD-10-CM | POA: Insufficient documentation

## 2016-03-13 LAB — COMPREHENSIVE METABOLIC PANEL
ALBUMIN: 4.5 g/dL (ref 3.5–5.0)
ALK PHOS: 40 U/L (ref 38–126)
ALT: 12 U/L — ABNORMAL LOW (ref 14–54)
ANION GAP: 7 (ref 5–15)
AST: 16 U/L (ref 15–41)
BILIRUBIN TOTAL: 0.5 mg/dL (ref 0.3–1.2)
BUN: 13 mg/dL (ref 6–20)
CALCIUM: 9.2 mg/dL (ref 8.9–10.3)
CO2: 26 mmol/L (ref 22–32)
Chloride: 106 mmol/L (ref 101–111)
Creatinine, Ser: 0.86 mg/dL (ref 0.44–1.00)
GFR calc Af Amer: >60 mL/min (ref >60)
Glucose, Bld: 94 mg/dL (ref 65–99)
POTASSIUM: 3.8 mmol/L (ref 3.5–5.1)
Sodium: 139 mmol/L (ref 135–145)
TOTAL PROTEIN: 7.2 g/dL (ref 6.5–8.1)

## 2016-03-13 LAB — CBC WITH DIFFERENTIAL/PLATELET
Basophils Absolute: 0 10*3/uL (ref 0–0.1)
Basophils Relative: 0 %
EOS PCT: 5 %
Eosinophils Absolute: 0.3 10*3/uL (ref 0–0.7)
HEMATOCRIT: 36.1 % (ref 35.0–47.0)
Hemoglobin: 12.2 g/dL (ref 12.0–16.0)
LYMPHS ABS: 2.2 10*3/uL (ref 1.0–3.6)
LYMPHS PCT: 34 %
MCH: 31.2 pg (ref 26.0–34.0)
MCHC: 33.8 g/dL (ref 32.0–36.0)
MCV: 92.2 fL (ref 80.0–100.0)
MONO ABS: 0.6 10*3/uL (ref 0.2–0.9)
MONOS PCT: 9 %
NEUTROS ABS: 3.4 10*3/uL (ref 1.4–6.5)
Neutrophils Relative %: 52 %
PLATELETS: 250 10*3/uL (ref 150–440)
RBC: 3.91 MIL/uL (ref 3.80–5.20)
RDW: 13 % (ref 11.5–14.5)
WBC: 6.6 10*3/uL (ref 3.6–11.0)

## 2016-03-13 LAB — TROPONIN I: Troponin I: <0.03 ng/mL (ref <0.03)

## 2016-03-13 LAB — CKMB (ARMC ONLY): CK, MB: 2.7 ng/mL (ref 0.5–5.0)

## 2016-03-13 LAB — CK: CK TOTAL: 101 U/L (ref 38–234)

## 2016-03-13 LAB — FIBRIN DERIVATIVES D-DIMER (ARMC ONLY): Fibrin derivatives D-dimer (ARMC): 116 (ref 0–499)

## 2016-03-13 MED ORDER — MELOXICAM 15 MG PO TABS: 15.0000 mg | ORAL_TABLET | Freq: Every day | ORAL | 0 refills | Status: DC

## 2016-03-13 MED ORDER — GI COCKTAIL ~~LOC~~
30.0000 mL | Freq: Once | ORAL | Status: AC
  Administered 2016-03-13: 30 mL via ORAL

## 2016-03-13 MED ORDER — KETOROLAC TROMETHAMINE 60 MG/2ML IM SOLN
60.0000 mg | Freq: Once | INTRAMUSCULAR | Status: AC
  Administered 2016-03-13: 60 mg via INTRAMUSCULAR

## 2016-03-13 MED ORDER — SUCRALFATE 1 G PO TABS: 2.0000 g | ORAL_TABLET | Freq: Two times a day (BID) | ORAL | 0 refills | Status: DC

## 2016-03-13 NOTE — Telephone Encounter (Signed)
Pt called and stated that she has been having chest pain on and off since Christmas. She said that she feels it more on her right side. She is also having rt shoulder pain. Sent call to team health triage.  Call pt @ [phone]

## 2016-03-13 NOTE — ED Provider Notes (Signed)
[facility]    CSN: [id number] Arrival date & time: 03/13/16  1737     History   Chief Complaint Chief Complaint  Patient presents with  . Chest Pain    HPI Virginia Maldonado is a 46 y.o. female.   The history is provided by the patient. No language interpreter was used.  Chest Pain  Pain location:  Substernal area, L chest and L lateral chest Pain quality: aching and sharp   Pain radiates to:  Does not radiate Pain severity:  Moderate Duration:  4 weeks Timing:  Constant Progression:  Worsening Chronicity:  Recurrent Context: eating   Relieved by:  Certain positions (wearing tight clothes) Ineffective treatments:  None tried Associated symptoms: abdominal pain and shortness of breath     Past Medical History:  Diagnosis Date  . COPD (chronic obstructive pulmonary disease) [facility](HCC)     Patient Active Problem List   Diagnosis Date Noted  . Dysuria 04/26/2015  . 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 Allergic rhinitis 06/02/2013  . Encounter for preventive health examination 06/02/2013  . Irritable bowel syndrome (IBS) 06/02/2013  . Abnormal finding on chest xray 03/22/2013  . Chest pain 08/12/2012    Past Surgical History:  Procedure Laterality Date  . HERNIA REPAIR      OB History    No data available       Home Medications    Prior to Admission medications   Medication Sig Start Date End Date Taking? Authorizing Provider  loratadine (CLARITIN) 10 MG tablet Take 10 mg by mouth daily.   Yes Historical Provider, MD  ibuprofen (ADVIL) 200 MG tablet Take 600 mg by mouth every 6 (six) hours as needed.    Historical Provider, MD  meloxicam (MOBIC) 15 MG tablet Take 1 tablet (15 mg total) by mouth daily. 03/13/16   Hassan RowanEugene Derald Lorge, MD  ranitidine (ZANTAC) 150 MG tablet Take 150 mg by mouth 2 (two) times daily.     Historical Provider, MD  sucralfate (CARAFATE) 1 g tablet Take 2 tablets (2 g total) by  mouth 2 (two) times daily. An hour before eating. 03/13/16   Hassan RowanEugene Rayhaan Huster, MD    Family History History reviewed. No pertinent family history.  Social History Social History  Substance Use Topics  . Smoking status: Former Smoker    Types: Cigarettes    Quit date: 01/23/1996  . Smokeless tobacco: Never Used  . Alcohol use Yes     Allergies   Prilosec [omeprazole] and Sertraline hcl   Review of Systems Review of Systems  Respiratory: Positive for shortness of breath.   Cardiovascular: Positive for chest pain.  Gastrointestinal: Positive for abdominal pain.  All other systems reviewed and are negative.    Physical Exam Triage Vital Signs ED Triage Vitals  Enc Vitals Group     BP 03/13/16 1756 126/65     Pulse Rate 03/13/16 1756 78     Resp 03/13/16 1756 16     Temp 03/13/16 1756 97.8 F (36.6 C)     Temp Source 03/13/16 1756 Oral     SpO2 03/13/16 1756 100 %     Weight 03/13/16 1752 143 lb (64.9 kg)     Height 03/13/16 1752 5\' 6"  (1.676 m)     Head Circumference --      Peak Flow --      Pain Score 03/13/16 1755 5     Pain Loc --  Pain Edu? --      Excl. in GC? --    No data found.   Updated Vital Signs BP 126/65 (BP Location: Left Arm)   Pulse 78   Temp 97.8 F (36.6 C) (Oral)   Resp 16   Ht 5\' 6"  (1.676 m)   Wt 143 lb (64.9 kg)   LMP 03/02/2016 (Approximate) Comment: no chance of preg per pt  SpO2 100%   BMI 23.08 kg/m   Visual Acuity Right Eye Distance:   Left Eye Distance:   Bilateral Distance:    Right Eye Near:   Left Eye Near:    Bilateral Near:     Physical Exam  Constitutional: She is oriented to person, place, and time. She appears well-developed and well-nourished.  HENT:  Head: Normocephalic and atraumatic.  Right Ear: External ear normal.  Left Ear: External ear normal.  Mouth/Throat: Oropharynx is clear and moist.  Eyes: Pupils are equal, round, and reactive to light.  Neck: Normal range of motion. Neck supple.    Cardiovascular: Normal rate, regular rhythm, normal heart sounds and intact distal pulses.   Pulmonary/Chest: Effort normal and breath sounds normal.  Abdominal: Soft.  Musculoskeletal: Normal range of motion.  Neurological: She is alert and oriented to person, place, and time.  Skin: Skin is warm.  Psychiatric: She has a normal mood and affect.  Vitals reviewed.    UC Treatments / Results  Labs (all labs ordered are listed, but only abnormal results are displayed) Labs Reviewed  COMPREHENSIVE METABOLIC PANEL - Abnormal; Notable for the following:       Result Value   ALT 12 (*)    All other components within normal limits  CBC WITH DIFFERENTIAL/PLATELET  FIBRIN DERIVATIVES D-DIMER ([facility] ONLY)  CK  CKMB([facility] ONLY)  TROPONIN I    EKG  EKG Interpretation None        ED ECG REPORT I, Chanae Gemma H, the attending physician, personally viewed and interpreted this ECG.   Date: 03/13/2016  EKG Time: 18::07  Rate: 61  Rhythm: normal sinus rhythm  Axis: 33  Intervals:none  ST&T Change: none Radiology Dg Chest 2 View  Result Date: 03/13/2016 CLINICAL DATA:  Left-sided chest pain since Christmas. EXAM: CHEST  2 VIEW COMPARISON:  08/12/2012 FINDINGS: The heart size and mediastinal contours are within normal limits. Both lungs are clear. The visualized skeletal structures are unremarkable. IMPRESSION: No active cardiopulmonary disease. Electronically Signed   By: Tollie Eth M.D.   On: 03/13/2016 19:34    Procedures Procedures (including critical care time)  Medications Ordered in UC Medications  gi cocktail (Maalox,Lidocaine,Donnatal) (30 mLs Oral Given 03/13/16 1911)  ketorolac (TORADOL) injection 60 mg (60 mg Intramuscular Given 03/13/16 1908)   Results for orders placed or performed during the hospital encounter of 03/13/16  CBC with Differential  Result Value Ref Range   WBC 6.6 3.6 - 11.0 K/uL   RBC 3.91 3.80 - 5.20 MIL/uL   Hemoglobin 12.2 12.0 - 16.0 g/dL    HCT 29.5 62.1 - 30.8 %   MCV 92.2 80.0 - 100.0 fL   MCH 31.2 26.0 - 34.0 pg   MCHC 33.8 32.0 - 36.0 g/dL   RDW 65.7 84.6 - 96.2 %   Platelets 250 150 - 440 K/uL   Neutrophils Relative % 52 %   Neutro Abs 3.4 1.4 - 6.5 K/uL   Lymphocytes Relative 34 %   Lymphs Abs 2.2 1.0 - 3.6 K/uL   Monocytes Relative 9 %  Monocytes Absolute 0.6 0.2 - 0.9 K/uL   Eosinophils Relative 5 %   Eosinophils Absolute 0.3 0 - 0.7 K/uL   Basophils Relative 0 %   Basophils Absolute 0.0 0 - 0.1 K/uL  Fibrin derivatives D-Dimer  Result Value Ref Range   Fibrin derivatives D-dimer ([facility]) 116 0 - 499  Comprehensive metabolic panel  Result Value Ref Range   Sodium 139 135 - 145 mmol/L   Potassium 3.8 3.5 - 5.1 mmol/L   Chloride 106 101 - 111 mmol/L   CO2 26 22 - 32 mmol/L   Glucose, Bld 94 65 - 99 mg/dL   BUN 13 6 - 20 mg/dL   Creatinine, Ser 1.61 0.44 - 1.00 mg/dL   Calcium 9.2 8.9 - 09.6 mg/dL   Total Protein 7.2 6.5 - 8.1 g/dL   Albumin 4.5 3.5 - 5.0 g/dL   AST 16 15 - 41 U/L   ALT 12 (L) 14 - 54 U/L   Alkaline Phosphatase 40 38 - 126 U/L   Total Bilirubin 0.5 0.3 - 1.2 mg/dL   GFR calc non Af Amer >60 >60 mL/min   GFR calc Af Amer >60 >60 mL/min   Anion gap 7 5 - 15  CK  Result Value Ref Range   Total CK 101 38 - 234 U/L  CKMB([facility] only)  Result Value Ref Range   CK, MB 2.7 0.5 - 5.0 ng/mL  Troponin I  Result Value Ref Range   Troponin I <0.03 <0.03 ng/mL    Initial Impression / Assessment and Plan / UC Course  I have reviewed the triage vital signs and the nursing notes.  Pertinent labs & imaging results that were available during my care of the patient were reviewed by me and considered in my medical decision making (see chart for details).   patient's chest pain is very nonspecific. While she is tenderness along the the left sternal border and along the left chest wall EKGs normal chest x-rays normal are cardiac enzymes and lab works are normal as well. With the Toradol injection  cocktail she notes improvement. We'll place on Carafate 2 tablets twice a day see if this helps and also Mobic 15 mg 1 tablet day and see her PCP in 1-2 weeks for follow-up and reevaluation. We'll give her a note for work for today and tomorrow.   Final Clinical Impressions(s) / UC Diagnoses   Final diagnoses:  Chest wall pain  Atypical chest pain    New Prescriptions New Prescriptions   MELOXICAM (MOBIC) 15 MG TABLET    Take 1 tablet (15 mg total) by mouth daily.   SUCRALFATE (CARAFATE) 1 G TABLET    Take 2 tablets (2 g total) by mouth 2 (two) times daily. An hour before eating.     Note: This dictation was prepared with Dragon dictation along with smaller phrase technology. Any transcriptional errors that result from this process are unintentional.     Hassan Rowan, MD 03/13/16 [phone]

## 2016-03-13 NOTE — ED Triage Notes (Signed)
Patient c/o chest pain on her left side of the chest since Christmas.  Patient denies SOB.  Patient denies N/V.

## 2016-03-14 ENCOUNTER — Telehealth: Payer: Self-pay | Admitting: *Deleted

## 2016-03-14 NOTE — Telephone Encounter (Signed)
Patient seen at ED 

## 2016-03-14 NOTE — Telephone Encounter (Signed)
Pt was seen in [facility] on 01/23 for chest pain, she was advised to follow up in two weeks with PCP. Please give a time and date, pt preferred mornings before noon .  Pt contact [phone]

## 2016-03-21 ENCOUNTER — Encounter: Payer: Self-pay | Admitting: Internal Medicine

## 2016-03-21 ENCOUNTER — Ambulatory Visit (INDEPENDENT_AMBULATORY_CARE_PROVIDER_SITE_OTHER): Payer: BLUE CROSS/BLUE SHIELD | Admitting: Internal Medicine

## 2016-03-21 ENCOUNTER — Ambulatory Visit (INDEPENDENT_AMBULATORY_CARE_PROVIDER_SITE_OTHER): Payer: BLUE CROSS/BLUE SHIELD

## 2016-03-21 VITALS — BP 120/84 | HR 62 | Temp 98.3°F | Resp 16 | Ht 66.0 in | Wt 149.0 lb

## 2016-03-21 DIAGNOSIS — Z1239 Encounter for other screening for malignant neoplasm of breast: Secondary | ICD-10-CM

## 2016-03-21 DIAGNOSIS — R0789 Other chest pain: Secondary | ICD-10-CM

## 2016-03-21 DIAGNOSIS — R0782 Intercostal pain: Secondary | ICD-10-CM | POA: Diagnosis not present

## 2016-03-21 DIAGNOSIS — Z1231 Encounter for screening mammogram for malignant neoplasm of breast: Secondary | ICD-10-CM | POA: Diagnosis not present

## 2016-03-21 MED ORDER — PANTOPRAZOLE SODIUM 40 MG PO TBEC: 40.0000 mg | DELAYED_RELEASE_TABLET | Freq: Every day | ORAL | 3 refills | Status: DC

## 2016-03-21 NOTE — Progress Notes (Signed)
Pre visit review using our clinic review tool, if applicable. No additional management support is needed unless otherwise documented below in the visit note. 

## 2016-03-21 NOTE — Patient Instructions (Addendum)
/  Your heart and lungs are not the cause of your pain  It is either a shoulder (rotator cuff) issue    Continue sucralfate twice daily  1 hour before eating and 2 hours before medications  Stop zantac if you can tolerate pantoprazole (protonix) once daily without hives.  You can take the pantoprazole with the meloxicam once DAILY  X RAYS OF SHOULDER AND THORACIC SPINE today  Referral to sports medicine in process

## 2016-03-21 NOTE — Progress Notes (Signed)
Subjective:  Patient ID: Virginia Maldonado, female    DOB: [birth year]  Age: 46 y.o. MRN: [id number]  CC: The primary encounter diagnosis was Breast cancer screening. Diagnoses of Left-sided chest wall pain and Intercostal pain were also pertinent to this visit.  HPI Virginia Maldonado presents for follow up on persistent left lateral chest wall pain.  The pain is chronic and has been intermittent for nearly two years.  [facility]  for escalation of chest pain described as a dull ache that had become persistent.  Was treated for gastritis with sucralfate and zantac, given remote history of duodenal ulcer,  Started in  2015 region of the  left axillary region to shoulder and radiates to area under left breast     Feels better if she presses on the area. Does not bother her during exercise.    Has occasional esophageal spasms that occur  History  of duodenal ulcers with pain that wold radiate to back ,  Found on EGD in 2001 resolve on second  EGD  2004  Outpatient Medications Prior to Visit  Medication Sig Dispense Refill  . loratadine (CLARITIN) 10 MG tablet Take 10 mg by mouth daily.    . meloxicam (MOBIC) 15 MG tablet Take 1 tablet (15 mg total) by mouth daily. 30 tablet 0  . ranitidine (ZANTAC) 150 MG tablet Take 150 mg by mouth 2 (two) times daily.     . sucralfate (CARAFATE) 1 g tablet Take 2 tablets (2 g total) by mouth 2 (two) times daily. An hour before eating. 120 tablet 0  . ibuprofen (ADVIL) 200 MG tablet Take 600 mg by mouth every 6 (six) hours as needed.     No facility-administered medications prior to visit.     Review of Systems;  Patient denies headache, fevers, malaise, unintentional weight loss, skin rash, eye pain, sinus congestion and sinus pain, sore throat, dysphagia,  hemoptysis , cough, dyspnea, wheezing, chest pain, palpitations, orthopnea, edema, abdominal pain, nausea, melena, diarrhea, constipation, flank pain, dysuria, hematuria, urinary   Frequency, nocturia, numbness, tingling, seizures,  Focal weakness, Loss of consciousness,  Tremor, insomnia, depression, anxiety, and suicidal ideation.      Objective:  BP 120/84   Pulse 62   Temp 98.3 F (36.8 C) (Oral)   Resp 16   Ht 5\' 6"  (1.676 m)   Wt 149 lb (67.6 kg)   LMP 03/02/2016 (Approximate) Comment: no chance of preg per pt  SpO2 98%   BMI 24.05 kg/m   BP Readings from Last 3 Encounters:  03/21/16 120/84  03/13/16 126/65  04/26/15 104/68    Wt Readings from Last 3 Encounters:  03/21/16 149 lb (67.6 kg)  03/13/16 143 lb (64.9 kg)  04/26/15 146 lb (66.2 kg)    General appearance: alert, cooperative and appears stated age Ears: normal TM's and external ear canals both ears Throat: lips, mucosa, and tongue normal; teeth and gums normal Neck: no adenopathy, no carotid bruit, supple, symmetrical, trachea midline and thyroid not enlarged, symmetric, no tenderness/mass/nodules Back: symmetric, no curvature. ROM normal. No CVA tenderness. Lungs: clear to auscultation bilaterally Heart: regular rate and rhythm, S1, S2 normal, no murmur, click, rub or gallop Abdomen: soft, non-tender; bowel sounds normal; no masses,  no organomegaly Pulses: 2+ and symmetric Skin: Skin color, texture, turgor normal. No rashes or lesions Lymph nodes: Cervical, supraclavicular, and axillary nodes normal.  No results found for: HGBA1C  Lab Results  Component  Value Date   CREATININE 0.86 03/13/2016   CREATININE 0.9 06/01/2013   CREATININE 0.9 08/12/2012    Lab Results  Component Value Date   WBC 6.6 03/13/2016   HGB 12.2 03/13/2016   HCT 36.1 03/13/2016   PLT 250 03/13/2016   GLUCOSE 94 03/13/2016   CHOL 211 (H) 06/01/2013   TRIG 135.0 06/01/2013   HDL 52.00 06/01/2013   LDLCALC 132 (H) 06/01/2013   ALT 12 (L) 03/13/2016   AST 16 03/13/2016   NA 139 03/13/2016   K 3.8 03/13/2016   CL 106 03/13/2016   CREATININE 0.86 03/13/2016   BUN 13 03/13/2016   CO2 26 03/13/2016     TSH 2.00 06/01/2013    Dg Chest 2 View  Result Date: 03/13/2016 CLINICAL DATA:  Left-sided chest pain since Christmas. EXAM: CHEST  2 VIEW COMPARISON:  08/12/2012 FINDINGS: The heart size and mediastinal contours are within normal limits. Both lungs are clear. The visualized skeletal structures are unremarkable. IMPRESSION: No active cardiopulmonary disease. Electronically Signed   By: Tollie Eth M.D.   On: 03/13/2016 19:34    Assessment & Plan:   Problem List Items Addressed This Visit    Left-sided chest wall pain    Left lateral , involving scapula, axillary and area under left breast.  Reviewed ER visit which included ekg and involved treatment for gastritis. Plain films of shoulder and thoracic spine were  Done today and only suggestive of levoscoliosis affecting the spine . Sports medicine referral recommended      Relevant Orders   DG Thoracic Spine W/Swimmers (Completed)   DG Shoulder Left (Completed)    Other Visit Diagnoses    Breast cancer screening    -  Primary   Relevant Orders   MM SCREENING BREAST TOMO BILATERAL      I am having Ms. Rosemond start on pantoprazole. I am also having her maintain her ranitidine, ibuprofen, loratadine, meloxicam, and sucralfate.  Meds ordered this encounter  Medications  . pantoprazole (PROTONIX) 40 MG tablet    Sig: Take 1 tablet (40 mg total) by mouth daily.    Dispense:  30 tablet    Refill:  3    There are no discontinued medications.  Follow-up: No Follow-up on file.   Sherlene Shams, MD

## 2016-03-24 NOTE — Assessment & Plan Note (Addendum)
Left lateral , involving scapula, axillary and area under left breast.  Reviewed ER visit which included ekg and involved treatment for gastritis. Plain films of shoulder and thoracic spine were  Done today and only suggestive of levoscoliosis affecting the spine . Sports medicine referral recommended

## 2016-04-02 ENCOUNTER — Telehealth: Payer: Self-pay | Admitting: *Deleted

## 2016-04-02 NOTE — Telephone Encounter (Signed)
Pt requested to have her mammogram referral sent to [facility]. Pt recalls last mammogram in 2015 Pt contact [phone]

## 2016-04-06 ENCOUNTER — Encounter: Payer: Self-pay | Admitting: Internal Medicine

## 2016-04-26 ENCOUNTER — Encounter: Payer: Self-pay | Admitting: Internal Medicine

## 2016-04-26 ENCOUNTER — Ambulatory Visit (INDEPENDENT_AMBULATORY_CARE_PROVIDER_SITE_OTHER): Payer: BLUE CROSS/BLUE SHIELD | Admitting: Internal Medicine

## 2016-04-26 ENCOUNTER — Other Ambulatory Visit (HOSPITAL_COMMUNITY)
Admission: RE | Admit: 2016-04-26 | Discharge: 2016-04-26 | Disposition: A | Discharge status: Home / Self Care | Payer: BLUE CROSS/BLUE SHIELD | Source: Ambulatory Visit | Attending: Internal Medicine | Admitting: Internal Medicine

## 2016-04-26 VITALS — BP 120/80 | HR 60 | Resp 16 | Ht 66.0 in | Wt 147.0 lb

## 2016-04-26 DIAGNOSIS — Z23 Encounter for immunization: Secondary | ICD-10-CM

## 2016-04-26 DIAGNOSIS — R1310 Dysphagia, unspecified: Secondary | ICD-10-CM

## 2016-04-26 DIAGNOSIS — R0789 Other chest pain: Secondary | ICD-10-CM

## 2016-04-26 DIAGNOSIS — R1319 Other dysphagia: Secondary | ICD-10-CM

## 2016-04-26 DIAGNOSIS — R5383 Other fatigue: Secondary | ICD-10-CM | POA: Diagnosis not present

## 2016-04-26 DIAGNOSIS — Z0000 Encounter for general adult medical examination without abnormal findings: Secondary | ICD-10-CM | POA: Diagnosis not present

## 2016-04-26 DIAGNOSIS — E7800 Pure hypercholesterolemia, unspecified: Secondary | ICD-10-CM | POA: Diagnosis not present

## 2016-04-26 DIAGNOSIS — Z01411 Encounter for gynecological examination (general) (routine) with abnormal findings: Secondary | ICD-10-CM | POA: Insufficient documentation

## 2016-04-26 DIAGNOSIS — Z1151 Encounter for screening for human papillomavirus (HPV): Secondary | ICD-10-CM | POA: Diagnosis present

## 2016-04-26 DIAGNOSIS — E559 Vitamin D deficiency, unspecified: Secondary | ICD-10-CM | POA: Diagnosis not present

## 2016-04-26 DIAGNOSIS — Z124 Encounter for screening for malignant neoplasm of cervix: Secondary | ICD-10-CM

## 2016-04-26 LAB — LIPID PANEL
CHOLESTEROL: 201 mg/dL — AB (ref 0–200)
HDL: 46.1 mg/dL (ref >39.00)
LDL CALC: 134 mg/dL — AB (ref 0–99)
NonHDL: 154.55
TRIGLYCERIDES: 101 mg/dL (ref 0.0–149.0)
Total CHOL/HDL Ratio: 4
VLDL: 20.2 mg/dL (ref 0.0–40.0)

## 2016-04-26 LAB — CBC WITH DIFFERENTIAL/PLATELET
BASOS PCT: 1.1 % (ref 0.0–3.0)
Basophils Absolute: 0.1 10*3/uL (ref 0.0–0.1)
EOS ABS: 0.3 10*3/uL (ref 0.0–0.7)
EOS PCT: 3.4 % (ref 0.0–5.0)
HEMATOCRIT: 36 % (ref 36.0–46.0)
HEMOGLOBIN: 11.9 g/dL — AB (ref 12.0–15.0)
LYMPHS PCT: 28.4 % (ref 12.0–46.0)
Lymphs Abs: 2.1 10*3/uL (ref 0.7–4.0)
MCHC: 33.2 g/dL (ref 30.0–36.0)
MCV: 92.5 fl (ref 78.0–100.0)
MONO ABS: 0.4 10*3/uL (ref 0.1–1.0)
Monocytes Relative: 5.9 % (ref 3.0–12.0)
Neutro Abs: 4.6 10*3/uL (ref 1.4–7.7)
Neutrophils Relative %: 61.2 % (ref 43.0–77.0)
PLATELETS: 293 10*3/uL (ref 150.0–400.0)
RBC: 3.89 Mil/uL (ref 3.87–5.11)
RDW: 13.3 % (ref 11.5–15.5)
WBC: 7.5 10*3/uL (ref 4.0–10.5)

## 2016-04-26 LAB — TSH: TSH: 1.7 u[IU]/mL (ref 0.35–4.50)

## 2016-04-26 LAB — VITAMIN D 25 HYDROXY (VIT D DEFICIENCY, FRACTURES): VITD: 18.78 ng/mL — AB (ref 30.00–100.00)

## 2016-04-26 NOTE — Patient Instructions (Signed)
I think your symptoms may be due to esophageal spasm  And have ordered a barium swallow and a GI referral  Continue taking the protonix daily for reflux  Health Maintenance, Female Adopting a healthy lifestyle and getting preventive care can go a long way to promote health and wellness. Talk with your health care provider about what schedule of regular examinations is right for you. This is a good chance for you to check in with your provider about disease prevention and staying healthy. In between checkups, there are plenty of things you can do on your own. Experts have done a lot of research about which lifestyle changes and preventive measures are most likely to keep you healthy. Ask your health care provider for more information. Weight and diet Eat a healthy diet  Be sure to include plenty of vegetables, fruits, low-fat dairy products, and lean protein.  Do not eat a lot of foods high in solid fats, added sugars, or salt.  Get regular exercise. This is one of the most important things you can do for your health.  Most adults should exercise for at least 150 minutes each week. The exercise should increase your heart rate and make you sweat (moderate-intensity exercise).  Most adults should also do strengthening exercises at least twice a week. This is in addition to the moderate-intensity exercise. Maintain a healthy weight  Body mass index (BMI) is a measurement that can be used to identify possible weight problems. It estimates body fat based on height and weight. Your health care provider can help determine your BMI and help you achieve or maintain a healthy weight.  For females 7 years of age and older:  A BMI below 18.5 is considered underweight.  A BMI of 18.5 to 24.9 is normal.  A BMI of 25 to 29.9 is considered overweight.  A BMI of 30 and above is considered obese. Watch levels of cholesterol and blood lipids  You should start having your blood tested for lipids and  cholesterol at 46 years of age, then have this test every 5 years.  You may need to have your cholesterol levels checked more often if:  Your lipid or cholesterol levels are high.  You are older than 46 years of age.  You are at high risk for heart disease. Cancer screening Lung Cancer  Lung cancer screening is recommended for adults 87-64 years old who are at high risk for lung cancer because of a history of smoking.  A yearly low-dose CT scan of the lungs is recommended for people who:  Currently smoke.  Have quit within the past 15 years.  Have at least a 30-pack-year history of smoking. A pack year is smoking an average of one pack of cigarettes a day for 1 year.  Yearly screening should continue until it has been 15 years since you quit.  Yearly screening should stop if you develop a health problem that would prevent you from having lung cancer treatment. Breast Cancer  Practice breast self-awareness. This means understanding how your breasts normally appear and feel.  It also means doing regular breast self-exams. Let your health care provider know about any changes, no matter how small.  If you are in your 20s or 30s, you should have a clinical breast exam (CBE) by a health care provider every 1-3 years as part of a regular health exam.  If you are 6 or older, have a CBE every year. Also consider having a breast X-ray (mammogram) every year.  If you have a family history of breast cancer, talk to your health care provider about genetic screening.  If you are at high risk for breast cancer, talk to your health care provider about having an MRI and a mammogram every year.  Breast cancer gene (BRCA) assessment is recommended for women who have family members with BRCA-related cancers. BRCA-related cancers include:  Breast.  Ovarian.  Tubal.  Peritoneal cancers.  Results of the assessment will determine the need for genetic counseling and BRCA1 and BRCA2  testing. Cervical Cancer  Your health care provider may recommend that you be screened regularly for cancer of the pelvic organs (ovaries, uterus, and vagina). This screening involves a pelvic examination, including checking for microscopic changes to the surface of your cervix (Pap test). You may be encouraged to have this screening done every 3 years, beginning at age 7.  For women ages 48-65, health care providers may recommend pelvic exams and Pap testing every 3 years, or they may recommend the Pap and pelvic exam, combined with testing for human papilloma virus (HPV), every 5 years. Some types of HPV increase your risk of cervical cancer. Testing for HPV may also be done on women of any age with unclear Pap test results.  Other health care providers may not recommend any screening for nonpregnant women who are considered low risk for pelvic cancer and who do not have symptoms. Ask your health care provider if a screening pelvic exam is right for you.  If you have had past treatment for cervical cancer or a condition that could lead to cancer, you need Pap tests and screening for cancer for at least 20 years after your treatment. If Pap tests have been discontinued, your risk factors (such as having a new sexual partner) need to be reassessed to determine if screening should resume. Some women have medical problems that increase the chance of getting cervical cancer. In these cases, your health care provider may recommend more frequent screening and Pap tests. Colorectal Cancer  This type of cancer can be detected and often prevented.  Routine colorectal cancer screening usually begins at 46 years of age and continues through 46 years of age.  Your health care provider may recommend screening at an earlier age if you have risk factors for colon cancer.  Your health care provider may also recommend using home test kits to check for hidden blood in the stool.  A small camera at the end of a  tube can be used to examine your colon directly (sigmoidoscopy or colonoscopy). This is done to check for the earliest forms of colorectal cancer.  Routine screening usually begins at age 23.  Direct examination of the colon should be repeated every 5-10 years through 46 years of age. However, you may need to be screened more often if early forms of precancerous polyps or small growths are found. Skin Cancer  Check your skin from head to toe regularly.  Tell your health care provider about any new moles or changes in moles, especially if there is a change in a mole's shape or color.  Also tell your health care provider if you have a mole that is larger than the size of a pencil eraser.  Always use sunscreen. Apply sunscreen liberally and repeatedly throughout the day.  Protect yourself by wearing long sleeves, pants, a wide-brimmed hat, and sunglasses whenever you are outside. Heart disease, diabetes, and high blood pressure  High blood pressure causes heart disease and increases the risk  of stroke. High blood pressure is more likely to develop in:  People who have blood pressure in the high end of the normal range (130-139/85-89 mm Hg).  People who are overweight or obese.  People who are African American.  If you are 70-79 years of age, have your blood pressure checked every 3-5 years. If you are 68 years of age or older, have your blood pressure checked every year. You should have your blood pressure measured twice-once when you are at a hospital or clinic, and once when you are not at a hospital or clinic. Record the average of the two measurements. To check your blood pressure when you are not at a hospital or clinic, you can use:  An automated blood pressure machine at a pharmacy.  A home blood pressure monitor.  If you are between 69 years and 66 years old, ask your health care provider if you should take aspirin to prevent strokes.  Have regular diabetes screenings. This  involves taking a blood sample to check your fasting blood sugar level.  If you are at a normal weight and have a low risk for diabetes, have this test once every three years after 46 years of age.  If you are overweight and have a high risk for diabetes, consider being tested at a younger age or more often. Preventing infection Hepatitis B  If you have a higher risk for hepatitis B, you should be screened for this virus. You are considered at high risk for hepatitis B if:  You were born in a country where hepatitis B is common. Ask your health care provider which countries are considered high risk.  Your parents were born in a high-risk country, and you have not been immunized against hepatitis B (hepatitis B vaccine).  You have HIV or AIDS.  You use needles to inject street drugs.  You live with someone who has hepatitis B.  You have had sex with someone who has hepatitis B.  You get hemodialysis treatment.  You take certain medicines for conditions, including cancer, organ transplantation, and autoimmune conditions. Hepatitis C  Blood testing is recommended for:  Everyone born from [birth year] through 1965.  Anyone with known risk factors for hepatitis C. Sexually transmitted infections (STIs)  You should be screened for sexually transmitted infections (STIs) including gonorrhea and chlamydia if:  You are sexually active and are younger than 46 years of age.  You are older than 46 years of age and your health care provider tells you that you are at risk for this type of infection.  Your sexual activity has changed since you were last screened and you are at an increased risk for chlamydia or gonorrhea. Ask your health care provider if you are at risk.  If you do not have HIV, but are at risk, it may be recommended that you take a prescription medicine daily to prevent HIV infection. This is called pre-exposure prophylaxis (PrEP). You are considered at risk if:  You are  sexually active and do not regularly use condoms or know the HIV status of your partner(s).  You take drugs by injection.  You are sexually active with a partner who has HIV. Talk with your health care provider about whether you are at high risk of being infected with HIV. If you choose to begin PrEP, you should first be tested for HIV. You should then be tested every 3 months for as long as you are taking PrEP. Pregnancy  If you are  premenopausal and you may become pregnant, ask your health care provider about preconception counseling.  If you may become pregnant, take 400 to 800 micrograms (mcg) of folic acid every day.  If you want to prevent pregnancy, talk to your health care provider about birth control (contraception). Osteoporosis and menopause  Osteoporosis is a disease in which the bones lose minerals and strength with aging. This can result in serious bone fractures. Your risk for osteoporosis can be identified using a bone density scan.  If you are 45 years of age or older, or if you are at risk for osteoporosis and fractures, ask your health care provider if you should be screened.  Ask your health care provider whether you should take a calcium or vitamin D supplement to lower your risk for osteoporosis.  Menopause may have certain physical symptoms and risks.  Hormone replacement therapy may reduce some of these symptoms and risks. Talk to your health care provider about whether hormone replacement therapy is right for you. Follow these instructions at home:  Schedule regular health, dental, and eye exams.  Stay current with your immunizations.  Do not use any tobacco products including cigarettes, chewing tobacco, or electronic cigarettes.  If you are pregnant, do not drink alcohol.  If you are breastfeeding, limit how much and how often you drink alcohol.  Limit alcohol intake to no more than 1 drink per day for nonpregnant women. One drink equals 12 ounces of  beer, 5 ounces of wine, or 1 ounces of hard liquor.  Do not use street drugs.  Do not share needles.  Ask your health care provider for help if you need support or information about quitting drugs.  Tell your health care provider if you often feel depressed.  Tell your health care provider if you have ever been abused or do not feel safe at home. This information is not intended to replace advice given to you by your health care provider. Make sure you discuss any questions you have with your health care provider. Document Released: 08/21/2010 Document Revised: 07/14/2015 Document Reviewed: 11/09/2014 Elsevier Interactive Patient Education  2017 [employer].

## 2016-04-26 NOTE — Progress Notes (Signed)
Pre visit review using our clinic review tool, if applicable. No additional management support is needed unless otherwise documented below in the visit note. 

## 2016-04-26 NOTE — Progress Notes (Addendum)
Patient ID: Virginia Maldonado, female    DOB: 1970/11/18  Age: 46 y.o. MRN: [id number]  The patient is here for annual  examination and management of other chronic and acute problems.  Mammogram ordered  Last pap 2015    The risk factors are reflected in the social history.  The roster of all physicians providing medical care to patient - is listed in the Snapshot section of the chart.   Home safety : The patient has smoke detectors in the home. They wear seatbelts.  There are no firearms at home. There is no violence in the home.   There is no risks for hepatitis, STDs or HIV. There is no   history of blood transfusion. They have no travel history to infectious disease endemic areas of the world.  The patient has seen their dentist in the last six month. They have seen their eye doctor in the last year.    They do not  have excessive sun exposure. Discussed the need for sun protection: hats, long sleeves and use of sunscreen if there is significant sun exposure.   Diet: the importance of a healthy diet is discussed. They do have a healthy diet.  The benefits of regular aerobic exercise were discussed. She walks 4 times per week ,  20 minutes.   Depression screen: there are no signs or vegative symptoms of depression- irritability, change in appetite, anhedonia, sadness/tearfullness.    The following portions of the patient's history were reviewed and updated as appropriate: allergies, current medications, past family history, past medical history,  past surgical history, past social history  and problem list.  Visual acuity was not assessed per patient preference since she has regular follow up with her ophthalmologist. Hearing and body mass index were assessed and reviewed.   During the course of the visit the patient was educated and counseled about appropriate screening and preventive services including : fall prevention , diabetes screening, nutrition counseling, colorectal cancer  screening, and recommended immunizations.    CC: The primary encounter diagnosis was Encounter for preventive health examination. Diagnoses of Esophageal dysphagia, Cervical cancer screening, Fatigue, unspecified type, Vitamin D deficiency, Pure hypercholesterolemia, Need for Tdap vaccination, and Left-sided chest wall pain were also pertinent to this visit.   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  Can't tolerate bras.  Notes that gus gets "stuck in m esophagus "  When eating.  Has avoided smoke and alvohol since 89.  follows a gluten free diet. Less nausea since starting protonix      History Virginia Maldonado has a past medical history of COPD (chronic obstructive pulmonary disease) (HCC).   She has a past surgical history that includes Hernia repair and Breast excisional biopsy (Right, 1995).   Her family history is not on file.She reports that she quit smoking about 20 years ago. Her smoking use included Cigarettes. She has never used smokeless tobacco. She reports that she drinks alcohol. She reports that she does not use drugs.  Outpatient Medications Prior to Visit  Medication Sig Dispense Refill  . pantoprazole (PROTONIX) 40 MG tablet Take 1 tablet (40 mg total) by mouth daily. 30 tablet 3  . ibuprofen (ADVIL) 200 MG tablet Take 600 mg by mouth every 6 (six) hours as needed.    . loratadine (CLARITIN) 10 MG  tablet Take 10 mg by mouth daily.    . meloxicam (MOBIC) 15 MG tablet Take 1 tablet (15 mg total) by mouth daily. (Patient not taking: Reported on 04/26/2016) 30 tablet 0  . ranitidine (ZANTAC) 150 MG tablet Take 150 mg by mouth 2 (two) times daily.     . sucralfate (CARAFATE) 1 g tablet Take 2 tablets (2 g total) by mouth 2 (two) times daily. An hour before eating. (Patient not taking: Reported on 04/26/2016) 120 tablet 0   No  facility-administered medications prior to visit.     Review of Systems   Patient denies headache, fevers, malaise, unintentional weight loss, skin rash, eye pain, sinus congestion and sinus pain, sore throat, dysphagia,  hemoptysis , cough, dyspnea, wheezing, palpitations, orthopnea, edema, abdominal pain, nausea, melena, diarrhea, constipation, flank pain, dysuria, hematuria, urinary  Frequency, nocturia, numbness, tingling, seizures,  Focal weakness, Loss of consciousness,  Tremor, insomnia, depression, anxiety, and suicidal ideation.      Objective:  BP 120/80   Pulse 60   Resp 16   Ht 5\' 6"  (1.676 m)   Wt 147 lb (66.7 kg)   LMP 04/23/2016   SpO2 97%   BMI 23.73 kg/m   Physical Exam   General Appearance:    Alert, cooperative, no distress, appears stated age  Head:    Normocephalic, without obvious abnormality, atraumatic  Eyes:    PERRL, conjunctiva/corneas clear, EOM's intact, fundi    benign, both eyes  Ears:    Normal TM's and external ear canals, both ears  Nose:   Nares normal, septum midline, mucosa normal, no drainage    or sinus tenderness  Throat:   Lips, mucosa, and tongue normal; teeth and gums normal  Neck:   Supple, symmetrical, trachea midline, no adenopathy;    thyroid:  no enlargement/tenderness/nodules; no carotid   bruit or JVD  Back:     Symmetric, no curvature, ROM normal, no CVA tenderness  Lungs:     Clear to auscultation bilaterally, respirations unlabored  Chest Wall:    No tenderness or deformity   Heart:    Regular rate and rhythm, S1 and S2 normal, no murmur, rub   or gallop  Breast Exam:    No tenderness, masses, or nipple abnormality  Abdomen:     Soft, non-tender, bowel sounds active all four quadrants,    no masses, no organomegaly  Genitalia:    Pelvic: cervix normal in appearance, external genitalia normal, no adnexal masses or tenderness, no cervical motion tenderness, rectovaginal septum normal, uterus normal size, shape, and  consistency and vagina normal without discharge  Extremities:   Extremities normal, atraumatic, no cyanosis or edema  Pulses:   2+ and symmetric all extremities  Skin:   Skin color, texture, turgor normal, no rashes or lesions  Lymph nodes:   Cervical, supraclavicular, and axillary nodes normal  Neurologic:   CNII-XII intact, normal strength, sensation and reflexes    throughout     Assessment & Plan:   Problem List Items Addressed This Visit    Encounter for preventive health examination - Primary    Annual comprehensive preventive exam was done as well as an evaluation and management of chronic conditions .  During the course of the visit the patient was educated and counseled about appropriate screening and preventive services including :  diabetes screening, lipid analysis with projected  10 year  risk for CAD , nutrition counseling, breast, cervical and colorectal cancer screening, and recommended immunizations.  Printed  recommendations for health maintenance screenings was given      Left-sided chest wall pain    Dysphagia/esophageal spasm suspected.  PPI, sucralfate, DG esophagus and GI referral discussed       Other Visit Diagnoses    Esophageal dysphagia       Relevant Orders   DG Esophagus (Completed)   Ambulatory referral to Gastroenterology   Cervical cancer screening       Relevant Orders   Cytology - PAP (Completed)   Fatigue, unspecified type       Relevant Orders   CBC with Differential/Platelet (Completed)   TSH (Completed)   Vitamin D deficiency       Relevant Orders   VITAMIN D 25 Hydroxy (Vit-D Deficiency, Fractures) (Completed)   Pure hypercholesterolemia       Relevant Orders   Lipid panel (Completed)   Need for Tdap vaccination       Relevant Orders   Tdap vaccine greater than or equal to 7yo IM (Completed)      I have discontinued Ms. Atkerson's ranitidine, ibuprofen, loratadine, meloxicam, and sucralfate. I am also having her maintain her  pantoprazole and cetirizine.  Meds ordered this encounter  Medications  . cetirizine (ZYRTEC) 10 MG tablet    Sig: Take 10 mg by mouth daily.    Medications Discontinued During This Encounter  Medication Reason  . ibuprofen (ADVIL) 200 MG tablet Patient has not taken in last 30 days  . loratadine (CLARITIN) 10 MG tablet Patient has not taken in last 30 days  . meloxicam (MOBIC) 15 MG tablet Patient has not taken in last 30 days  . ranitidine (ZANTAC) 150 MG tablet Patient has not taken in last 30 days  . sucralfate (CARAFATE) 1 g tablet Patient has not taken in last 30 days    Follow-up: No Follow-up on file.   Sherlene Shams, MD

## 2016-04-28 NOTE — Assessment & Plan Note (Addendum)
Dysphagia/esophageal spasm suspected.  PPI, sucralfate, DG esophagus and GI referral discussed

## 2016-04-28 NOTE — Assessment & Plan Note (Signed)
Annual comprehensive preventive exam was done as well as an evaluation and management of chronic conditions .  During the course of the visit the patient was educated and counseled about appropriate screening and preventive services including :  diabetes screening, lipid analysis with projected  10 year  risk for CAD , nutrition counseling, breast, cervical and colorectal cancer screening, and recommended immunizations.  Printed recommendations for health maintenance screenings was given 

## 2016-04-29 ENCOUNTER — Other Ambulatory Visit: Payer: Self-pay | Admitting: Internal Medicine

## 2016-04-29 MED ORDER — ERGOCALCIFEROL 1.25 MG (50000 UT) PO CAPS: 50000.0000 [IU] | ORAL_CAPSULE | ORAL | 2 refills | Status: DC

## 2016-05-02 ENCOUNTER — Encounter: Payer: Self-pay | Admitting: Internal Medicine

## 2016-05-02 LAB — CYTOLOGY - PAP
Diagnosis: NEGATIVE
HPV: NOT DETECTED

## 2016-05-09 ENCOUNTER — Ambulatory Visit
Admission: RE | Admit: 2016-05-09 | Discharge: 2016-05-09 | Disposition: A | Discharge status: Home / Self Care | Payer: BLUE CROSS/BLUE SHIELD | Source: Ambulatory Visit | Attending: Internal Medicine | Admitting: Internal Medicine

## 2016-05-09 DIAGNOSIS — Z1239 Encounter for other screening for malignant neoplasm of breast: Secondary | ICD-10-CM

## 2016-05-09 DIAGNOSIS — Z1231 Encounter for screening mammogram for malignant neoplasm of breast: Secondary | ICD-10-CM | POA: Insufficient documentation

## 2016-05-14 ENCOUNTER — Ambulatory Visit
Admission: RE | Admit: 2016-05-14 | Discharge: 2016-05-14 | Disposition: A | Discharge status: Home / Self Care | Payer: BLUE CROSS/BLUE SHIELD | Source: Ambulatory Visit | Attending: Internal Medicine | Admitting: Internal Medicine

## 2016-05-14 ENCOUNTER — Encounter: Payer: Self-pay | Admitting: Internal Medicine

## 2016-05-14 DIAGNOSIS — R1319 Other dysphagia: Secondary | ICD-10-CM

## 2016-05-14 DIAGNOSIS — R1310 Dysphagia, unspecified: Secondary | ICD-10-CM | POA: Insufficient documentation

## 2016-05-28 ENCOUNTER — Ambulatory Visit (INDEPENDENT_AMBULATORY_CARE_PROVIDER_SITE_OTHER): Payer: BLUE CROSS/BLUE SHIELD | Admitting: Gastroenterology

## 2016-05-28 ENCOUNTER — Encounter: Payer: Self-pay | Admitting: Gastroenterology

## 2016-05-28 ENCOUNTER — Other Ambulatory Visit: Payer: Self-pay

## 2016-05-28 VITALS — BP 110/65 | HR 86 | Temp 97.9°F | Ht 66.0 in | Wt 151.0 lb

## 2016-05-28 DIAGNOSIS — K219 Gastro-esophageal reflux disease without esophagitis: Secondary | ICD-10-CM

## 2016-05-28 DIAGNOSIS — R140 Abdominal distension (gaseous): Secondary | ICD-10-CM | POA: Diagnosis not present

## 2016-05-28 DIAGNOSIS — R1310 Dysphagia, unspecified: Secondary | ICD-10-CM

## 2016-05-28 DIAGNOSIS — R0789 Other chest pain: Secondary | ICD-10-CM

## 2016-05-28 DIAGNOSIS — K5900 Constipation, unspecified: Secondary | ICD-10-CM

## 2016-05-28 DIAGNOSIS — R1319 Other dysphagia: Secondary | ICD-10-CM

## 2016-05-28 MED ORDER — POLYETHYLENE GLYCOL 3350 17 G PO PACK: 17.0000 g | PACK | Freq: Every day | ORAL | 0 refills | Status: DC

## 2016-05-28 NOTE — Progress Notes (Signed)
Gastroenterology Consultation  Referring Provider:     Sherlene Shams, MD Primary Care Physician:  Sherlene Shams, MD Primary Gastroenterologist:  Dr. Wyline Mood  Reason for Consultation:     Dysphagia         HPI:   Virginia Maldonado is a 46 y.o. y/o female referred for consultation & management  by Dr. Sherlene Shams, MD.  Dysphagia which is causing chest discomfort and abdominal distension  Dysphagia: Onset and any progression: began towards the end of last spring and has progressed  Frequency: once every few weeks  Foods affected : Mostly solids , usually the first bite, middle of the chest is the site of food getting stuck , mostly for meat, rice. Liquids go down ok Prior episodes of impaction: No  History of asthma/allergy : seasonal  History of heartburn/Reflux : yes - pantoprazole- 2 months  Weight loss : no   Prior EGD: yes x 2 - many years back for back pain  PPI/H2 blocker use : Protonix - morning -empty stomach , it has helped. Also has some chest pain center of her chest going to the left arm even on occasions when she does not eat . No chest pain when she walks, no chest pain when she works out.   Barium study on 05/14/16 shows narrowing of the distal esophagus.   Bloating - years, abdominal distension, when she passes gas it smells. Hard to button her pants. Occurs after dinner. Chews gum 1 stick a day , no diet sodas, no crystalite,  No sweetended tea, almond milk , worse with brocolli or brussels sprouts. 2x umbilical hernia surgeries and notices distension in the same area. Feels better after passing gas, has a bowel movement but not every day , bloating is worse when she has not had a bowel movement .   Past Medical History:  Diagnosis Date  . COPD (chronic obstructive pulmonary disease) (HCC)     Past Surgical History:  Procedure Laterality Date  . 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 HERNIA REPAIR      Prior to Admission medications     Medication Sig Start Date End Date Taking? Authorizing Provider  cetirizine (ZYRTEC) 10 MG tablet Take 10 mg by mouth daily.   Yes Historical Provider, MD  ergocalciferol (DRISDOL) 50000 units capsule Take 1 capsule (50,000 Units total) by mouth once a week. 04/29/16  Yes Sherlene Shams, MD  pantoprazole (PROTONIX) 40 MG tablet Take 1 tablet (40 mg total) by mouth daily. 03/21/16  Yes Sherlene Shams, MD  meloxicam (MOBIC) 15 MG tablet Take 15 mg by mouth daily. 03/13/16   Historical Provider, MD  sucralfate (CARAFATE) 1 g tablet take 2 tablets by mouth twice a day 1 hour before meals 03/13/16   Historical Provider, MD    No family history on file.   Social History  Substance Use Topics  . Smoking status: Former Smoker    Types: Cigarettes    Quit date: 01/23/1996  . Smokeless tobacco: Never Used  . Alcohol use Yes    Allergies as of 05/28/2016 - Review Complete 05/28/2016  Allergen Reaction Noted  . Prilosec [omeprazole] Hives 08/12/2012  . Sertraline hcl Rash 01/23/2011    Review of Systems:    All systems reviewed and negative except where noted in HPI.   Physical Exam:  BP 110/65 (BP Location: Left Arm, Patient Position: Sitting, Cuff Size: Normal)   Pulse 86   Temp  97.9 F (36.6 C) (Oral)   Ht  (1.676 m)   Wt 151 lb (68.5 kg)   BMI 24.37 kg/m  No LMP recorded. Psych:  Alert and cooperative. Normal mood and affect. General:   Alert,  Well-developed, well-nourished, pleasant and cooperative in NAD Head:  Normocephalic and atraumatic. Eyes:  Sclera clear, no icterus.   Conjunctiva pink. Ears:  Normal auditory acuity. Nose:  No deformity, discharge, or lesions. Mouth:  No deformity or lesions,oropharynx pink & moist. Neck:  Supple; no masses or thyromegaly. Lungs:  Respirations even and unlabored.  Clear throughout to auscultation.   No wheezes, crackles, or rhonchi. No acute distress. Heart:  Regular rate and rhythm; no murmurs, clicks, rubs, or gallops. Abdomen:   Normal bowel sounds.  No bruits.  Soft, non-tender and non-distended without masses, hepatosplenomegaly or hernias noted.  No guarding or rebound tenderness.    Neurologic:  Alert and oriented x3;  grossly normal neurologically. Skin:  Intact without significant lesions or rashes. No jaundice. Lymph Nodes:  No significant cervical adenopathy. Psych:  Alert and cooperative. Normal mood and affect.  Imaging Studies: Dg Esophagus  Result Date: 05/14/2016 CLINICAL DATA:  46 year old female with intermittent left-sided chest pain/ dysphagia. Initial encounter. EXAM: ESOPHOGRAM / BARIUM SWALLOW / BARIUM TABLET STUDY TECHNIQUE: Combined double contrast and single contrast examination performed using effervescent crystals, thick barium liquid, and thin barium liquid. The patient was observed with fluoroscopy swallowing a 13 mm barium sulphate tablet. FLUOROSCOPY TIME:  Fluoroscopy Time:  1 minutes and 12 seconds. Radiation Exposure Index (if provided by the fluoroscopic device): 9.9 mGy COMPARISON:  None. FINDINGS: No laryngeal penetration or aspiration. Normal primary esophageal stripping wave without obstructing lesion. Minimal smooth narrowing distal esophagus. 13 mm barium tablet traverses beyond this region without difficulty. No reflux demonstrated with change of patient position or drinking water. No mucosal abnormality noted. IMPRESSION: Minimal smooth narrowing distal esophagus otherwise negative exam. Electronically Signed   By: Lacy Duverney M.D.   On: 05/14/2016 09:45   Mm Screening Breast Tomo Bilateral  Result Date: 05/09/2016 CLINICAL DATA:  Screening. EXAM: 2D DIGITAL SCREENING BILATERAL MAMMOGRAM WITH CAD AND ADJUNCT TOMO COMPARISON:  Previous exam(s). ACR Breast Density Category c: The breast tissue is heterogeneously dense, which may obscure small masses. FINDINGS: There are no findings suspicious for malignancy. Images were processed with CAD. IMPRESSION: No mammographic evidence of  malignancy. A result letter of this screening mammogram will be mailed directly to the patient. RECOMMENDATION: Screening mammogram in one year. (Code:SM-B-01Y) BI-RADS CATEGORY  1: Negative. Electronically Signed   By: Frederico Hamman M.D.   On: 05/09/2016 14:07    Assessment and Plan:   Virginia Maldonado is a 46 y.o. 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 She also has some chest pain which is very likely secondary to GERD. Some cconstipation too   Plan  1. Chest pain radiating to left arm -refer to cardiology although by history suggestive of GI issue 2. Miralax daily for constipation  3. Continue Protonix 4. EGD  With duodenal biopsies and esophageal biospies to evaluate for strictures with dilation after cardiology evaluation  5. Low FODMAP diet .   I have discussed alternative options, risks & benefits,  which include, but are not limited to, bleeding, infection, perforation,respiratory complication &  drug reaction.  The patient agrees with this plan & written consent will be obtained.   .......... Follow up in 8-10 weeks  Dr Wyline Mood MD

## 2016-06-21 ENCOUNTER — Encounter: Payer: Self-pay | Admitting: Cardiology

## 2016-06-21 ENCOUNTER — Ambulatory Visit (INDEPENDENT_AMBULATORY_CARE_PROVIDER_SITE_OTHER): Payer: BLUE CROSS/BLUE SHIELD | Admitting: Cardiology

## 2016-06-21 VITALS — BP 122/64 | HR 58 | Ht 66.0 in | Wt 147.0 lb

## 2016-06-21 DIAGNOSIS — R079 Chest pain, unspecified: Secondary | ICD-10-CM

## 2016-06-21 NOTE — Patient Instructions (Addendum)
Testing/Procedures: Your physician has requested that you have an echocardiogram. Echocardiography is a painless test that uses sound waves to create images of your heart. It provides your doctor with information about the size and shape of your heart and how well your heart's chambers and valves are working. This procedure takes approximately one hour. There are no restrictions for this procedure.  Your physician has requested that you have a stress echocardiogram. For further information please visit [email or web address]. Please follow instruction sheet as given.   Do not drink or eat foods with caffeine for 24 hours before the test. (Chocolate, coffee, tea, or energy drinks)  If you use an inhaler, bring it with you to the test.  Do not smoke for 4 hours before the test.  Wear comfortable shoes and clothing.  Follow-Up: Your physician recommends that you schedule a follow-up appointment as needed. We will call you with results and if needed schedule follow up at that time.   It was a pleasure seeing you today here in the office. Please do not hesitate to give us a call back if you have any further questions. [phone]  Hillarie Harrigan A. RN, BSN    Echocardiogram An echocardiogram, or echocardiography, uses sound waves (ultrasound) to produce an image of your heart. The echocardiogram is simple, painless, obtained within a short period of time, and offers valuable information to your health care provider. The images from an echocardiogram can provide information such as:  Evidence of coronary artery disease (CAD).  Heart size.  Heart muscle function.  Heart valve function.  Aneurysm detection.  Evidence of a past heart attack.  Fluid buildup around the heart.  Heart muscle thickening.  Assess heart valve function. Tell a health care provider about:  Any allergies you have.  All medicines you are taking, including vitamins, herbs, eye drops, creams, and over-the-counter  medicines.  Any problems you or family members have had with anesthetic medicines.  Any blood disorders you have.  Any surgeries you have had.  Any medical conditions you have.  Whether you are pregnant or may be pregnant. What happens before the procedure? No special preparation is needed. Eat and drink normally. What happens during the procedure?  In order to produce an image of your heart, gel will be applied to your chest and a wand-like tool (transducer) will be moved over your chest. The gel will help transmit the sound waves from the transducer. The sound waves will harmlessly bounce off your heart to allow the heart images to be captured in real-time motion. These images will then be recorded.  You may need an IV to receive a medicine that improves the quality of the pictures. What happens after the procedure? You may return to your normal schedule including diet, activities, and medicines, unless your health care provider tells you otherwise. This information is not intended to replace advice given to you by your health care provider. Make sure you discuss any questions you have with your health care provider. Document Released: 02/03/2000 Document Revised: 09/24/2015 Document Reviewed: 10/13/2012 Elsevier Interactive Patient Education  2017 Elsevier Inc.  Exercise Stress Echocardiogram An exercise stress echocardiogram is a test that checks how well your heart is working. For this test, you will walk on a treadmill to make your heart beat faster. This test uses sound waves (ultrasound) and a computer to make pictures (images) of your heart. These pictures will be taken before you exercise and after you exercise. What happens before the procedure?  Follow instructions   from your doctor about what you cannot eat or drink before the test.  Do not drink or eat anything that has caffeine in it. Stop having caffeine for 24 hours before the test.  Ask your doctor about changing or  stopping your normal medicines. This is important if you take diabetes medicines or blood thinners. Ask your doctor if you should take your medicines with water before the test.  If you use an inhaler, bring it to the test.  Do not use any products that have nicotine or tobacco in them, such as cigarettes and e-cigarettes. Stop using them for 4 hours before the test. If you need help quitting, ask your doctor.  Wear comfortable shoes and clothing. What happens during the procedure?  You will be hooked up to a TV screen. Your doctor will watch the screen to see how fast your heart beats during the test.  Before you exercise, a computer will make a picture of your heart. To do this:  A gel will be put on your chest.  A wand will be moved over the gel.  Sound waves from the wand will go to the computer to make the picture.  Your will start walking on a treadmill. The treadmill will start at a slow speed. It will get faster a little bit at a time. When you walk faster, your heart will beat faster.  The treadmill will be stopped when your heart is working hard.  You will lie down right away so another picture of your heart can be taken.  The test will take 30-60 minutes. What happens after the procedure?  Your heart rate and blood pressure will be watched after the test.  If your doctor says that you can, you may:  Eat what you usually eat.  Do your normal activities.  Take medicines like normal. Summary  An exercise stress echocardiogram is a test that checks how well your heart is working.  Follow instructions about what you cannot eat or drink before the test. Ask your doctor if you should take your normal medicines before the test.  Stop having caffeine for 24 hours before the test. Do not use anything with nicotine or tobacco in it for 4 hours before the test.  A computer will take a picture of your heart before you walk on a treadmill. It will take another picture when  you are done walking.  Your heart rate and blood pressure will be watched after the test. This information is not intended to replace advice given to you by your health care provider. Make sure you discuss any questions you have with your health care provider. Document Released: 12/03/2008 Document Revised: 10/30/2015 Document Reviewed: 10/30/2015 Elsevier Interactive Patient Education  2017 Elsevier Inc.  

## 2016-06-21 NOTE — Progress Notes (Signed)
Cardiology Office Note   Date:  06/21/2016   ID:  Virginia Maldonado, DOB [birth year], MRN [id number]  Referring Doctor:  Sherlene ShamsULLO, TERESA L, MD   Cardiologist:   Almond LintAileen Taleshia Luff, MD   Reason for consultation:  Chief Complaint  Patient presents with  . other    New patient. Patient c/o Chest pain. Patient is have a procedure done and wants to make sure its okat to proceed with it. meds reviewed verbally with patient.       History of Present Illness: Virginia Maldonado is a 46 y.o. female who is being seen today for the evaluation of chest pain at the request of Wyline MoodAnna, Kiran, MD.  Patient reports onset for about a year now on and off. She describes the pain as a dull ache on the left side of the chest, sometimes radiating to the left shoulder and to the left upper arm. Mild to moderate in intensity, lasting a few seconds at a time. Sometimes brought on by stress but not always, sometimes brought on by position, sometimes better when laying on the left side. Sometimes improved by taking of her bra.  She also has a different kind of chest pain which is in the center that is likely related to esophageal spasm and reflux.  She is physically active and goes to the gym to work at least 4 times a week. She has no shortness of breath and no chest pain.  Her GI doctor just wants to be sure that there is nothing heart-related going on.  Patient denies PND, orthopnea, palpitations. No syncope.  ROS:  Please see the history of present illness. Aside from mentioned under HPI, all other systems are reviewed and negative.     Past Medical History:  Diagnosis Date  . COPD (chronic obstructive pulmonary disease) (HCC)     Past Surgical History:  Procedure Laterality Date  . 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 HERNIA REPAIR       reports that she quit smoking about 20 years ago. Her smoking use included Cigarettes. She has never used smokeless tobacco. She reports that she drinks alcohol.  She reports that she does not use drugs.   family history is not on file.   Outpatient Medications Prior to Visit  Medication Sig Dispense Refill  . cetirizine (ZYRTEC) 10 MG tablet Take 10 mg by mouth daily.    . ergocalciferol (DRISDOL) 50000 units capsule Take 1 capsule (50,000 Units total) by mouth once a week. 4 capsule 2  . pantoprazole (PROTONIX) 40 MG tablet Take 1 tablet (40 mg total) by mouth daily. 30 tablet 3  . meloxicam (MOBIC) 15 MG tablet Take 15 mg by mouth daily.  0  . polyethylene glycol (MIRALAX / GLYCOLAX) packet Take 17 g by mouth daily. (Patient not taking: Reported on 06/21/2016) 14 each 0  . sucralfate (CARAFATE) 1 g tablet take 2 tablets by mouth twice a day 1 hour before meals  0   No facility-administered medications prior to visit.      Allergies: Prilosec [omeprazole] and Sertraline hcl    PHYSICAL EXAM: VS:  BP 122/64 (BP Location: Right Arm, Patient Position: Sitting, Cuff Size: Normal)   Pulse (!) 58   Ht 5\' 6"  (1.676 m)   Wt 147 lb (66.7 kg)   BMI 23.73 kg/m  , Body mass index is 23.73 kg/m. Wt Readings from Last 3 Encounters:  06/21/16 147 lb (66.7 kg)  05/28/16 151 lb (  68.5 kg)  04/26/16 147 lb (66.7 kg)    GENERAL:  well developed, well nourished, not in acute distress HEENT: normocephalic, pink conjunctivae, anicteric sclerae, no xanthelasma, normal dentition, oropharynx clear NECK:  no neck vein engorgement, JVP normal, no hepatojugular reflux, carotid upstroke brisk and symmetric, no bruit, no thyromegaly, no lymphadenopathy LUNGS:  good respiratory effort, clear to auscultation bilaterally CV:  PMI not displaced, no thrills, no lifts, S1 and S2 within normal limits, no palpable S3 or S4, no murmurs, no rubs, no gallops ABD:  Soft, nontender, nondistended, normoactive bowel sounds, no abdominal aortic bruit, no hepatomegaly, no splenomegaly MS: nontender back, no kyphosis, no scoliosis, no joint deformities EXT:  2+ DP/PT pulses, no edema,  no varicosities, no cyanosis, no clubbing SKIN: warm, nondiaphoretic, normal turgor, no ulcers NEUROPSYCH: alert, oriented to person, place, and time, sensory/motor grossly intact, normal mood, appropriate affect  Recent Labs: 03/13/2016: ALT 12; BUN 13; Creatinine, Ser 0.86; Potassium 3.8; Sodium 139 04/26/2016: Hemoglobin 11.9; Platelets 293.0; TSH 1.70   Lipid Panel    Component Value Date/Time   CHOL 201 (H) 04/26/2016 1133   TRIG 101.0 04/26/2016 1133   HDL 46.10 04/26/2016 1133   CHOLHDL 4 04/26/2016 1133   VLDL 20.2 04/26/2016 1133   LDLCALC 134 (H) 04/26/2016 1133     Other studies Reviewed:  EKG:  The ekg from 06/21/2016 was personally reviewed by me and it revealed sinus rhythm, 50 BPM. Sinus arrhythmia. Otherwise normal EKG.  Additional studies/ records that were reviewed personally reviewed by me today include: None available   ASSESSMENT AND PLAN: Chest pain, atypical Recommend exercise stress echo and echocardiogram to rule out anything that may be cardiac related.   Current medicines are reviewed at length with the patient today.  The patient does not have concerns regarding medicines.  Labs/ tests ordered today include:  Orders Placed This Encounter  Procedures  . EKG 12-Lead  . ECHOCARDIOGRAM STRESS TEST  . ECHOCARDIOGRAM COMPLETE    I had a lengthy and detailed discussion with the patient regarding diagnoses, prognosis, diagnostic options.   I counseled the patient on importance of lifestyle modification including heart healthy diet, regular physical activity.   Disposition:   FU with Cardiology after tests prn  Thank you for this consultation. We will forwarding this consultation to referring physician.   Signed, Almond Lint, MD  06/21/2016 10:38 AM    [facility]  This note was generated in part with voice recognition software and I apologize for any typographical errors that were not detected and corrected.

## 2016-06-28 ENCOUNTER — Telehealth: Payer: Self-pay | Admitting: Cardiology

## 2016-06-28 NOTE — Telephone Encounter (Signed)
Cardiac clearance request received for EGD at [facility] [place]. They request that clearance be faxed to [phone] to Attention: Ethlyn GalleryPanya Carter. Will route this to Dr. Alvino ChapelIngal for her review. Patient is scheduled for stress echo and echo to be done which are pending appointments at this time.

## 2016-07-18 ENCOUNTER — Other Ambulatory Visit: Payer: Self-pay | Admitting: Internal Medicine

## 2016-08-06 ENCOUNTER — Ambulatory Visit (INDEPENDENT_AMBULATORY_CARE_PROVIDER_SITE_OTHER): Payer: BLUE CROSS/BLUE SHIELD | Admitting: Gastroenterology

## 2016-08-06 ENCOUNTER — Encounter: Payer: Self-pay | Admitting: Gastroenterology

## 2016-08-06 VITALS — BP 110/76 | HR 65 | Temp 98.1°F | Ht 66.0 in | Wt 147.8 lb

## 2016-08-06 DIAGNOSIS — R140 Abdominal distension (gaseous): Secondary | ICD-10-CM

## 2016-08-06 DIAGNOSIS — K5900 Constipation, unspecified: Secondary | ICD-10-CM | POA: Diagnosis not present

## 2016-08-06 DIAGNOSIS — R1310 Dysphagia, unspecified: Secondary | ICD-10-CM | POA: Diagnosis not present

## 2016-08-06 MED ORDER — POLYETHYLENE GLYCOL 3350 17 GM/SCOOP PO POWD: 1.0000 | Freq: Every day | ORAL | 0 refills | Status: DC

## 2016-08-06 NOTE — Progress Notes (Signed)
Wyline MoodKiran Alvina Strother MD, MRCP(U.K) [address]  [address]  [place], [place] [address]  Main: [phone]  Fax: [phone]   Primary Care Physician: Sherlene Shamsullo, Teresa L, MD  Primary Gastroenterologist:  Dr. Wyline MoodKiran Jeris Roser   No chief complaint on file.   HPI: Samani Rhett BannisterS Fackrell is a 46 y.o. female    She is here today to follow up for dysphagia,bloating   Summary of history :  She was initially seen on 05/28/16 for dysphagia that began towards the end of spring and had progressed, food got stuck in the middle of her chest mostly affecting solids. On protonix BID.Barium study on 05/14/16 shows narrowing of the distal esophagus.   Bloating - years, abdominal distension, when she passes gas it smells. Hard to button her pants. Occurs after dinner.  2x umbilical hernia surgeries and notices distension in the same area. Feels better after passing gas, has a bowel movement but not every day , bloating is worse when she has not had a bowel movement . 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  She was seen by cardiology and suggested a stress test and ECHO.   Every time she takes miralax she has a bowel movement . Stopped after she ran out of it. Last took it a few weeks back . Due for stress test on Thursday . Swallowing issues still persists. Has increased dietary fiber.    Current Outpatient Prescriptions  Medication Sig Dispense Refill  . cetirizine (ZYRTEC) 10 MG tablet Take 10 mg by mouth daily.    . ergocalciferol (DRISDOL) 50000 units capsule Take 1 capsule (50,000 Units total) by mouth once a week. 4 capsule 2  . pantoprazole (PROTONIX) 40 MG tablet take 1 tablet by mouth once daily 30 tablet 3   No  current facility-administered medications for this visit.     Allergies as of 08/06/2016 - Review Complete 06/21/2016  Allergen Reaction Noted  . Prilosec [omeprazole] Hives 08/12/2012  . Sertraline hcl Rash 01/23/2011    ROS:  General: Negative for anorexia, weight loss, fever, chills, fatigue, weakness. ENT: Negative for hoarseness, difficulty swallowing , nasal congestion. CV: Negative for chest pain, angina, palpitations, dyspnea on exertion, peripheral edema.  Respiratory: Negative for dyspnea at rest, dyspnea on exertion, cough, sputum, wheezing.  GI: See history of present illness. GU:  Negative for dysuria, hematuria, urinary incontinence, urinary frequency, nocturnal urination.  Endo: Negative for unusual weight change.    Physical Examination:   There were no vitals taken for this visit.  General: Well-nourished, well-developed in no acute distress.  Eyes: No icterus. Conjunctivae pink. Mouth: Oropharyngeal mucosa moist and pink , no lesions erythema or exudate. Lungs: Clear to auscultation bilaterally. Non-labored. Heart: Regular rate and rhythm, no murmurs rubs or gallops.  Abdomen: Bowel sounds are normal, nontender, nondistended, no hepatosplenomegaly or masses, no abdominal bruits or hernia , no rebound or guarding.   Extremities: No lower extremity edema. No clubbing or deformities. Neuro: Alert and oriented x 3.  Grossly intact. Skin: Warm and dry, no jaundice.   Psych: Alert and cooperative, normal mood and affect.   Imaging Studies: No results found.  Assessment and Plan:   Earlena S Brickman  is a 46 y.o. y/o female here today to follow up for dysphagia, abdominal bloating, constipation. Awaiting cardiac evaluation for chest discomfort.   Plan   1. EGD when obtains cardiac clearance 2. Restart daily miralax and if feels better then constipation is cause of bloating , if no better course of antibiotics for SIBO. If feels better then will change her diet to help  with constipation and try wean off miralax.    Dr Wyline Mood  MD,MRCP [facility]) Follow up in 3 months

## 2016-08-08 ENCOUNTER — Telehealth: Payer: Self-pay | Admitting: *Deleted

## 2016-08-08 NOTE — Telephone Encounter (Signed)
No answer. Left detailed message, ok per DPR, with date and time for Echo and stress echo 08/09/16 starting at 08:30 am. Preoprocedural instructions as stated on AVS from 06/21/16 left on message as well and for pt to call back if she has any questions.

## 2016-08-09 ENCOUNTER — Other Ambulatory Visit: Payer: Self-pay

## 2016-08-09 ENCOUNTER — Ambulatory Visit (INDEPENDENT_AMBULATORY_CARE_PROVIDER_SITE_OTHER): Payer: BLUE CROSS/BLUE SHIELD

## 2016-08-09 DIAGNOSIS — R079 Chest pain, unspecified: Secondary | ICD-10-CM | POA: Diagnosis not present

## 2016-08-10 LAB — ECHOCARDIOGRAM STRESS TEST
CSEPED: 14 min
CSEPEDS: 59 s
CSEPHR: 97 %
Estimated workload: 17.5 METS
MPHR: 175 {beats}/min
Peak HR: 171 {beats}/min
Rest HR: 58 {beats}/min

## 2016-08-23 NOTE — Telephone Encounter (Signed)
Normal echocardiogram and echo stress test Acceptable risk for surgery

## 2016-08-24 NOTE — Telephone Encounter (Signed)
Left detailed voicemail message that I have results and clearance to review with patient with return number.

## 2016-08-24 NOTE — Telephone Encounter (Signed)
Left detailed voicemail message that results were normal and cardiac clearance routed to [facility] with instructions to call back if she would like to discuss results.

## 2016-08-24 NOTE — Telephone Encounter (Signed)
Cardiac clearance routed to number provided.  

## 2016-08-30 ENCOUNTER — Other Ambulatory Visit: Payer: Self-pay | Admitting: Gastroenterology

## 2016-12-04 ENCOUNTER — Other Ambulatory Visit: Payer: Self-pay | Admitting: Internal Medicine

## 2017-01-02 ENCOUNTER — Other Ambulatory Visit: Payer: Self-pay | Admitting: Internal Medicine

## 2017-05-24 ENCOUNTER — Encounter: Payer: Self-pay | Admitting: Internal Medicine

## 2017-05-24 ENCOUNTER — Ambulatory Visit (INDEPENDENT_AMBULATORY_CARE_PROVIDER_SITE_OTHER): Payer: 59 | Admitting: Internal Medicine

## 2017-05-24 VITALS — BP 112/68 | HR 68 | Temp 97.7°F | Resp 15 | Ht 66.0 in | Wt 148.6 lb

## 2017-05-24 DIAGNOSIS — Z1239 Encounter for other screening for malignant neoplasm of breast: Secondary | ICD-10-CM

## 2017-05-24 DIAGNOSIS — Z0000 Encounter for general adult medical examination without abnormal findings: Secondary | ICD-10-CM | POA: Diagnosis not present

## 2017-05-24 DIAGNOSIS — R635 Abnormal weight gain: Secondary | ICD-10-CM | POA: Diagnosis not present

## 2017-05-24 DIAGNOSIS — J301 Allergic rhinitis due to pollen: Secondary | ICD-10-CM | POA: Diagnosis not present

## 2017-05-24 DIAGNOSIS — K581 Irritable bowel syndrome with constipation: Secondary | ICD-10-CM | POA: Diagnosis not present

## 2017-05-24 DIAGNOSIS — K5904 Chronic idiopathic constipation: Secondary | ICD-10-CM | POA: Diagnosis not present

## 2017-05-24 DIAGNOSIS — Z1231 Encounter for screening mammogram for malignant neoplasm of breast: Secondary | ICD-10-CM | POA: Diagnosis not present

## 2017-05-24 LAB — COMPREHENSIVE METABOLIC PANEL
ALK PHOS: 41 U/L (ref 39–117)
ALT: 12 U/L (ref 0–35)
AST: 19 U/L (ref 0–37)
Albumin: 4.6 g/dL (ref 3.5–5.2)
BILIRUBIN TOTAL: 0.7 mg/dL (ref 0.2–1.2)
BUN: 12 mg/dL (ref 6–23)
CALCIUM: 9.6 mg/dL (ref 8.4–10.5)
CO2: 28 meq/L (ref 19–32)
Chloride: 102 mEq/L (ref 96–112)
Creatinine, Ser: 0.89 mg/dL (ref 0.40–1.20)
GFR: 72.35 mL/min (ref >60.00)
Glucose, Bld: 79 mg/dL (ref 70–99)
Potassium: 4.5 mEq/L (ref 3.5–5.1)
Sodium: 138 mEq/L (ref 135–145)
Total Protein: 7.3 g/dL (ref 6.0–8.3)

## 2017-05-24 LAB — CBC WITH DIFFERENTIAL/PLATELET
BASOS PCT: 1.7 % (ref 0.0–3.0)
Basophils Absolute: 0.1 10*3/uL (ref 0.0–0.1)
EOS PCT: 6.1 % — AB (ref 0.0–5.0)
Eosinophils Absolute: 0.4 10*3/uL (ref 0.0–0.7)
HEMATOCRIT: 36.4 % (ref 36.0–46.0)
HEMOGLOBIN: 12.4 g/dL (ref 12.0–15.0)
Lymphocytes Relative: 36.3 % (ref 12.0–46.0)
Lymphs Abs: 2.1 10*3/uL (ref 0.7–4.0)
MCHC: 33.9 g/dL (ref 30.0–36.0)
MCV: 94.3 fl (ref 78.0–100.0)
MONO ABS: 0.4 10*3/uL (ref 0.1–1.0)
Monocytes Relative: 7.3 % (ref 3.0–12.0)
NEUTROS ABS: 2.9 10*3/uL (ref 1.4–7.7)
Neutrophils Relative %: 48.6 % (ref 43.0–77.0)
Platelets: 286 10*3/uL (ref 150.0–400.0)
RBC: 3.86 Mil/uL — ABNORMAL LOW (ref 3.87–5.11)
RDW: 13.4 % (ref 11.5–15.5)
WBC: 5.9 10*3/uL (ref 4.0–10.5)

## 2017-05-24 LAB — LIPID PANEL
CHOLESTEROL: 209 mg/dL — AB (ref 0–200)
HDL: 58 mg/dL (ref >39.00)
LDL CALC: 133 mg/dL — AB (ref 0–99)
NonHDL: 150.71
TRIGLYCERIDES: 91 mg/dL (ref 0.0–149.0)
Total CHOL/HDL Ratio: 4
VLDL: 18.2 mg/dL (ref 0.0–40.0)

## 2017-05-24 LAB — T4, FREE: FREE T4: 0.76 ng/dL (ref 0.60–1.60)

## 2017-05-24 LAB — MAGNESIUM: Magnesium: 2 mg/dL (ref 1.5–2.5)

## 2017-05-24 LAB — TSH: TSH: 2 u[IU]/mL (ref 0.35–4.50)

## 2017-05-24 MED ORDER — MONTELUKAST SODIUM 10 MG PO TABS: 10.0000 mg | ORAL_TABLET | Freq: Every day | ORAL | 3 refills | Status: DC

## 2017-05-24 NOTE — Progress Notes (Signed)
Patient ID: Bobetta Limemy S Sekula, female    DOB: [birth year]  Age: 47 y.o. MRN: [id number]  The patient is here for annual preventive examination and management of other chronic and acute problems.  PAP SMEAR normal 2018 3 D Mammogram normal March 2018   The risk factors are reflected in the social history.  The roster of all physicians providing medical care to patient - is listed in the Snapshot section of the chart.  Activities of daily living:  The patient is 100% independent in all ADLs: dressing, toileting, feeding as well as independent mobility  Home safety : The patient has smoke detectors in the home. They wear seatbelts.  There are no firearms at home. There is no violence in the home.   There is no risks for hepatitis, STDs or HIV. There is no   history of blood transfusion. They have no travel history to infectious disease endemic areas of the world.  The patient has seen their dentist in the last six month. They have seen their eye doctor in the last year. They deny any  hearing difficulty with regard to whispered voices and some television programs.     They do not  have excessive sun exposure. Discussed the need for sun protection: hats, long sleeves and use of sunscreen if there is significant sun exposure.   Diet: the importance of a healthy diet is discussed. They do have a healthy diet.  The benefits of regular aerobic exercise were discussed. Sheexercises  5 times per week,  60 miinutes.   Depression screen: there are no signs or vegative symptoms of depression- irritability, change in appetite, anhedonia, sadness/tearfullness.  Cognitive assessment: the patient manages all their financial and personal affairs and is actively engaged. They could relate day,date,year and events; recalled 2/3 objects at 3 minutes; performed clock-face test normally.  The following portions of the patient's history were reviewed and updated as appropriate: allergies, current medications, past  family history, past medical history,  past surgical history, past social history  and problem list.  Visual acuity was not assessed per patient preference since she has regular follow up with her ophthalmologist. Hearing and body mass index were assessed and reviewed.   During the course of the visit the patient was educated and counseled about appropriate screening and preventive services including : fall prevention , diabetes screening, nutrition counseling, colorectal cancer screening, and recommended immunizations.    CC: The primary encounter diagnosis was Encounter for preventive health examination. Diagnoses of Weight gain, Chronic idiopathic constipation, Seasonal allergic rhinitis due to pollen, Irritable bowel syndrome with constipation, and Breast cancer screening were also pertinent to this visit.  1) chronic constipation and bloating managed with daily miralax, but reports that she had a 10 lb weight gain (from 147 to 159 in a 30 day period ;  ) was moving bowels daily but had increasing abdominal distension.  Maternal history of hypothyroidism .takes a daily probiotic   2) recurrent erythema of cheeks accompanied by papules    3) Allergic rhinitis     History Denaisha has a past medical history of COPD (chronic obstructive pulmonary disease) (HCC).   She has a past surgical history that includes Hernia repair and Breast excisional biopsy (Right, 1995).   Her family history is not on file.She reports that she quit smoking about 21 years ago. Her smoking use included cigarettes. She has never used smokeless tobacco. She reports that she drinks alcohol. She reports that she does not use drugs.  Outpatient Medications Prior to Visit  Medication Sig Dispense Refill  . cetirizine (ZYRTEC) 10 MG tablet Take 10 mg by mouth daily.    . pantoprazole (PROTONIX) 40 MG tablet TAKE 1 TABLET BY MOUTH ONCE DAILY 30 tablet 4  . polyethylene glycol powder (GLYCOLAX/MIRALAX) powder take as directed  for colonoscopy prep 255 g 0  . ergocalciferol (DRISDOL) 50000 units capsule Take 1 capsule (50,000 Units total) by mouth once a week. (Patient not taking: Reported on 08/06/2016) 4 capsule 2   No facility-administered medications prior to visit.     Review of Systems   Patient denies headache, fevers, malaise, unintentional weight loss, skin rash, eye pain, sinus congestion and sinus pain, sore throat, dysphagia,  hemoptysis , cough, dyspnea, wheezing, chest pain, palpitations, orthopnea, edema, abdominal pain, nausea, melena, diarrhea, constipation, flank pain, dysuria, hematuria, urinary  Frequency, nocturia, numbness, tingling, seizures,  Focal weakness, Loss of consciousness,  Tremor, insomnia, depression, anxiety, and suicidal ideation.      Objective:  BP 112/68 (BP Location: Left Arm, Patient Position: Sitting, Cuff Size: Normal)   Pulse 68   Temp 97.7 F (36.5 C) (Oral)   Resp 15   Ht 5\' 6"  (1.676 m)   Wt 148 lb 9.6 oz (67.4 kg)   SpO2 99%   BMI 23.98 kg/m   Physical Exam   General appearance: alert, cooperative and appears stated age Head: Normocephalic, without obvious abnormality, atraumatic Eyes: conjunctivae/corneas clear. PERRL, EOM's intact. Fundi benign. Ears: normal TM's and external ear canals both ears Nose: Nares normal. Septum midline. Mucosa normal. No drainage or sinus tenderness. Throat: lips, mucosa, and tongue normal; teeth and gums normal Neck: no adenopathy, no carotid bruit, no JVD, supple, symmetrical, trachea midline and thyroid not enlarged, symmetric, no tenderness/mass/nodules Lungs: clear to auscultation bilaterally Breasts: normal appearance, no masses or tenderness Heart: regular rate and rhythm, S1, S2 normal, no murmur, click, rub or gallop Abdomen: soft, non-tender; bowel sounds normal; no masses,  no organomegaly Extremities: extremities normal, atraumatic, no cyanosis or edema Pulses: 2+ and symmetric Skin: Skin color, texture, turgor  normal. No rashes or lesions Neurologic: Alert and oriented X 3, normal strength and tone. Normal symmetric reflexes. Normal coordination and gait.      Assessment & Plan:   Problem List Items Addressed This Visit    Weight gain    Screen for thyroid  was normal   Lab Results  Component Value Date   TSH 2.00 05/24/2017         Relevant Orders   TSH (Completed)   Comprehensive metabolic panel (Completed)   T4, free (Completed)   Irritable bowel syndrome (IBS)    Constipation predominant.  Thyroid,  Lytes normal. Trial of linzess discussed.       Encounter for preventive health examination - Primary    Annual comprehensive preventive exam was done as well as an evaluation and management of chronic conditions .  During the course of the visit the patient was educated and counseled about appropriate screening and preventive services including :  diabetes screening, lipid analysis with projected  10 year  risk for CAD , nutrition counseling, breast, cervical and colorectal cancer screening, and recommended immunizations.  Printed recommendations for health maintenance screenings was given      Relevant Orders   Lipid panel (Completed)   Allergic rhinitis    Antihistamine advised for daily use,  Will add Singulair if not improvement      Relevant Orders   CBC with Differential/Platelet (  Completed)    Other Visit Diagnoses    Chronic idiopathic constipation       Relevant Orders   Magnesium (Completed)   Breast cancer screening       Relevant Orders   MM 3D SCREEN BREAST BILATERAL      I have discontinued Adaley S. Brogdon's ergocalciferol. I am also having her start on montelukast. Additionally, I am having her maintain her cetirizine, polyethylene glycol powder, and pantoprazole.  Meds ordered this encounter  Medications  . montelukast (SINGULAIR) 10 MG tablet    Sig: Take 1 tablet (10 mg total) by mouth at bedtime.    Dispense:  30 tablet    Refill:  3    Medications  Discontinued During This Encounter  Medication Reason  . ergocalciferol (DRISDOL) 50000 units capsule Completed Course    Follow-up: No follow-ups on file.   Sherlene Shams, MD

## 2017-05-24 NOTE — Patient Instructions (Addendum)
For your allergic rash ,  You can use Benadryl but you should also consider adding one of these newer second generation antihistamines that are longer acting, non sedating and  available OTC:  Generic  Zyrtec, which is cetirizine.    generic Allegra , available generically as fexofenadine ; comes in 60 mg and 180 mg once daily strengths.    Generic Claritin :  also available as loratidine .    Generic Singulair for rash that does not resolve with daily use of allegra    we can always  Try  Linzess for chronic constipation   Linaclotide oral capsules What is this medicine? LINACLOTIDE (lin a KLOE tide) is used to treat irritable bowel syndrome (IBS) with constipation as the main problem. It may also be used for relief of chronic constipation. This medicine may be used for other purposes; ask your health care provider or pharmacist if you have questions. COMMON BRAND NAME(S): Linzess What should I tell my health care provider before I take this medicine? They need to know if you have any of these conditions: -history of stool (fecal) impaction -now have diarrhea or have diarrhea often -other medical condition -stomach or intestinal disease, including bowel obstruction or abdominal adhesions -an unusual or allergic reaction to linaclotide, other medicines, foods, dyes, or preservatives -pregnant or trying to get pregnant -breast-feeding How should I use this medicine? Take this medicine by mouth with a glass of water. Follow the directions on the prescription label. Do not cut, crush or chew this medicine. Take on an empty stomach, at least 30 minutes before your first meal of the day. Take your medicine at regular intervals. Do not take your medicine more often than directed. Do not stop taking except on your doctor's advice. A special MedGuide will be given to you by the pharmacist with each prescription and refill. Be sure to read this information carefully each time. Talk to your  pediatrician regarding the use of this medicine in children. This medicine is not approved for use in children. Overdosage: If you think you have taken too much of this medicine contact a poison control center or emergency room at once. NOTE: This medicine is only for you. Do not share this medicine with others. What if I miss a dose? If you miss a dose, just skip that dose. Wait until your next dose, and take only that dose. Do not take double or extra doses. What may interact with this medicine? -certain medicines for bowel problems or bladder incontinence (these can cause constipation) This list may not describe all possible interactions. Give your health care provider a list of all the medicines, herbs, non-prescription drugs, or dietary supplements you use. Also tell them if you smoke, drink alcohol, or use illegal drugs. Some items may interact with your medicine. What should I watch for while using this medicine? Visit your doctor for regular check ups. Tell your doctor if your symptoms do not get better or if they get worse. Diarrhea is a common side effect of this medicine. It often begins within 2 weeks of starting this medicine. Stop taking this medicine and call your doctor if you get severe diarrhea. Stop taking this medicine and call your doctor or go to the nearest hospital emergency room right away if you develop unusual or severe stomach-area (abdominal) pain, especially if you also have bright red, bloody stools or black stools that look like tar. What side effects may I notice from receiving this medicine? Side effects  that you should report to your doctor or health care professional as soon as possible: -allergic reactions like skin rash, itching or hives, swelling of the face, lips, or tongue -black, tarry stools -bloody or watery diarrhea -new or worsening stomach pain -severe or prolonged diarrhea Side effects that usually do not require medical attention (report to your  doctor or health care professional if they continue or are bothersome): -bloating -gas -loose stools This list may not describe all possible side effects. Call your doctor for medical advice about side effects. You may report side effects to FDA at 1-800-FDA-1088. Where should I keep my medicine? Keep out of the reach of children. Store at room temperature between 20 and 25 degrees C (68 and 77 degrees F). Keep this medicine in the original container. Keep tightly closed in a dry place. Do not remove the desiccant packet from the bottle, it helps to protect your medicine from moisture. Throw away any unused medicine after the expiration date. NOTE: This sheet is a summary. It may not cover all possible information. If you have questions about this medicine, talk to your doctor, pharmacist, or health care provider.  2018 Elsevier/Gold Standard (2015-03-10 12:17:04)

## 2017-05-26 DIAGNOSIS — R635 Abnormal weight gain: Secondary | ICD-10-CM | POA: Insufficient documentation

## 2017-05-26 NOTE — Assessment & Plan Note (Addendum)
Screen for thyroid  was normal   Lab Results  Component Value Date   TSH 2.00 05/24/2017

## 2017-05-26 NOTE — Assessment & Plan Note (Signed)
Annual comprehensive preventive exam was done as well as an evaluation and management of chronic conditions .  During the course of the visit the patient was educated and counseled about appropriate screening and preventive services including :  diabetes screening, lipid analysis with projected  10 year  risk for CAD , nutrition counseling, breast, cervical and colorectal cancer screening, and recommended immunizations.  Printed recommendations for health maintenance screenings was given 

## 2017-05-26 NOTE — Assessment & Plan Note (Addendum)
Constipation predominant.  Thyroid,  Lytes normal. Trial of linzess discussed.

## 2017-05-26 NOTE — Assessment & Plan Note (Signed)
Antihistamine advised for daily use,  Will add Singulair if not improvement

## 2017-06-04 ENCOUNTER — Other Ambulatory Visit: Payer: Self-pay | Admitting: Internal Medicine

## 2017-07-18 ENCOUNTER — Ambulatory Visit
Admission: RE | Admit: 2017-07-18 | Discharge: 2017-07-18 | Disposition: A | Discharge status: Home / Self Care | Payer: 59 | Source: Ambulatory Visit | Attending: Internal Medicine | Admitting: Internal Medicine

## 2017-07-18 DIAGNOSIS — Z1231 Encounter for screening mammogram for malignant neoplasm of breast: Secondary | ICD-10-CM | POA: Insufficient documentation

## 2017-07-18 DIAGNOSIS — Z1239 Encounter for other screening for malignant neoplasm of breast: Secondary | ICD-10-CM

## 2017-11-18 IMAGING — DX DG SHOULDER 2+V*L*
3 series · 3 of 3 positions shown · non-contrast
Comparison: Chest x-ray 03/13/2016

CLINICAL DATA: Scapular pain

EXAM:
LEFT SHOULDER - 2+ VIEW

[shoulder (grashey) ap]
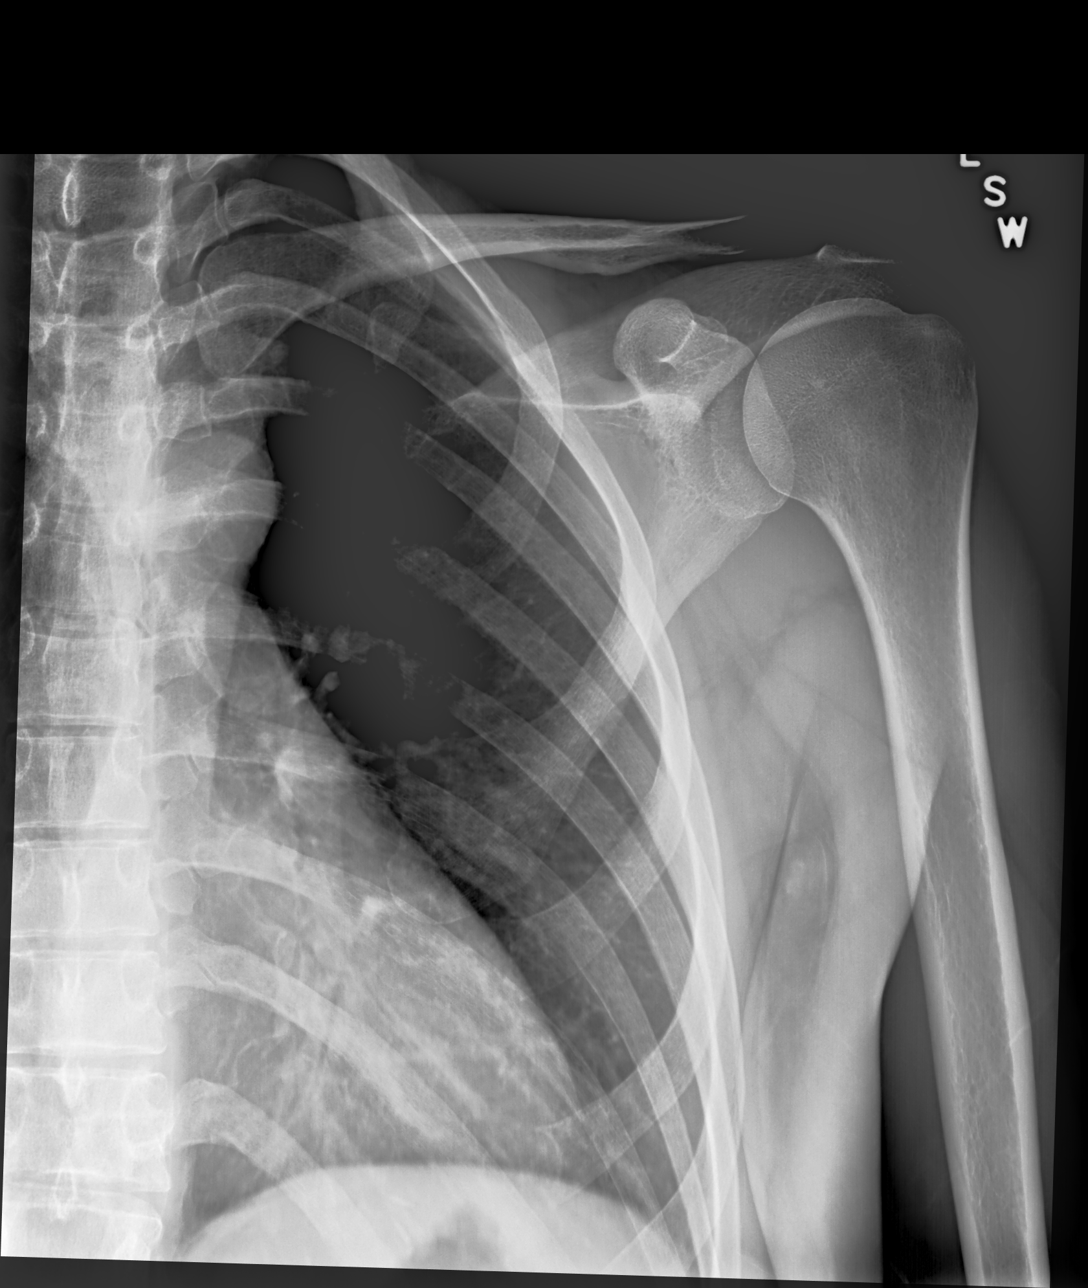

[shoulder y view]
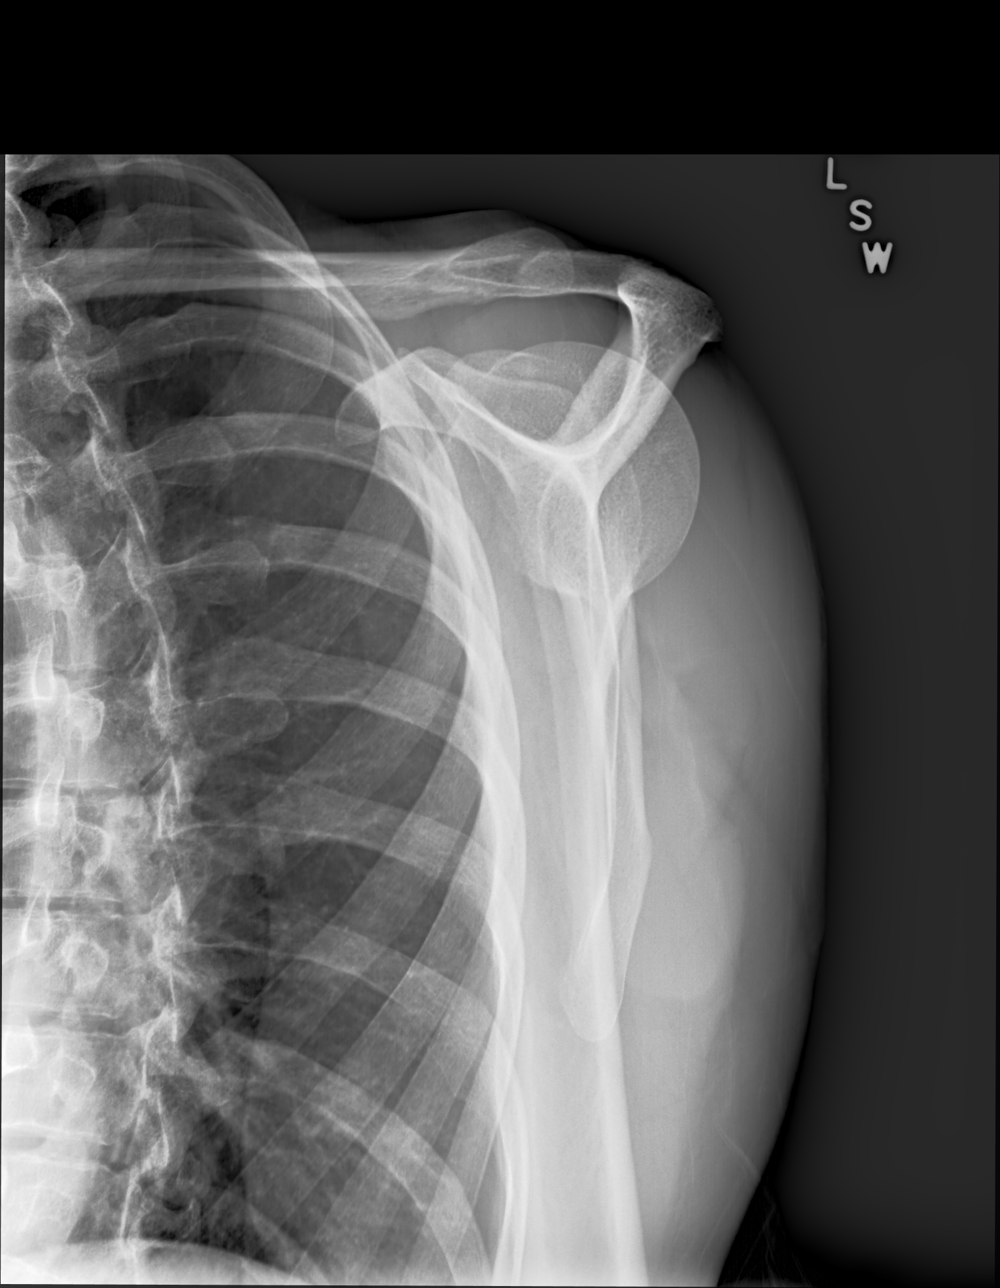

[shoulder axillary pa]
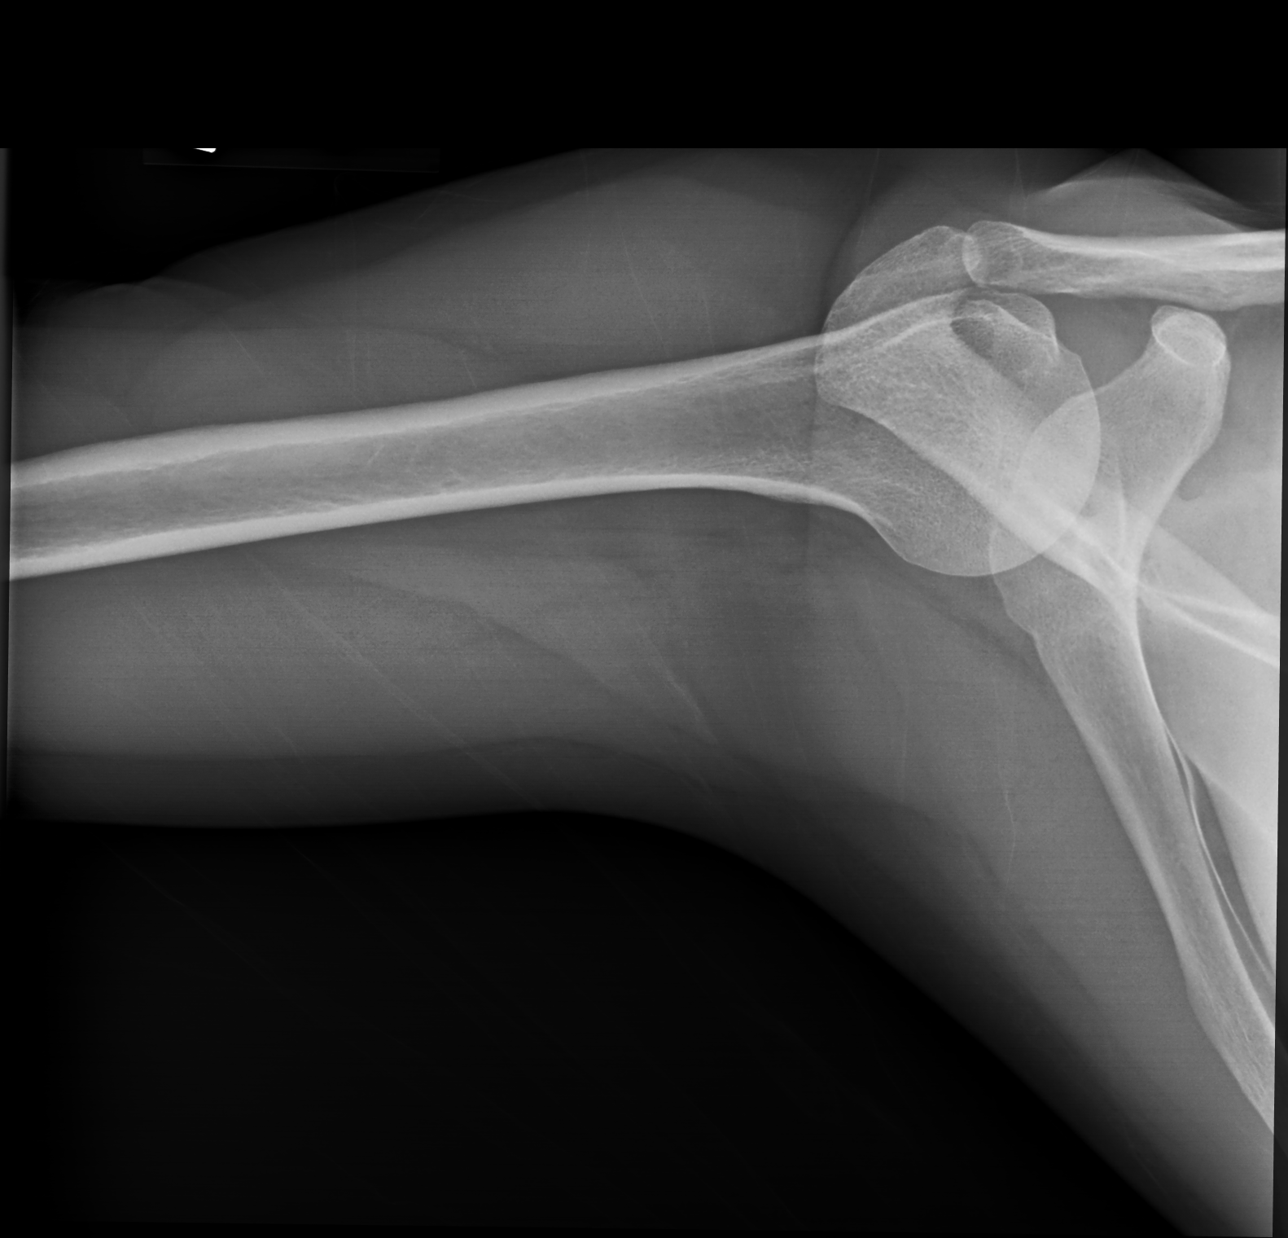

[3 of 3 positions shown; findings below may reference images not displayed]

FINDINGS: There is no evidence of fracture or dislocation. There is no
evidence of arthropathy or other focal bone abnormality. Soft
tissues are unremarkable.
IMPRESSION: Negative.

## 2018-01-11 IMAGING — RF DG ESOPHAGUS
12 of 17 series · 14 of 23 positions shown · non-contrast
Comparison: None.

CLINICAL DATA: 45-year-old female with intermittent left-sided
chest pain/ dysphagia. Initial encounter.

EXAM:
ESOPHOGRAM / BARIUM SWALLOW / BARIUM TABLET STUDY
TECHNIQUE: Combined double contrast and single contrast examination performed
using effervescent crystals, thick barium liquid, and thin barium
liquid. The patient was observed with fluoroscopy swallowing a 13 mm
barium sulphate tablet.
FLUOROSCOPY TIME:  Fluoroscopy Time:  1 minutes and 12 seconds.
Radiation Exposure Index (if provided by the fluoroscopic device):
9.9 mGy

[Series 1: cp_standard · 0.26mm/px · 1 of 1 slices shown (1 of 10)]
[im 1/1]
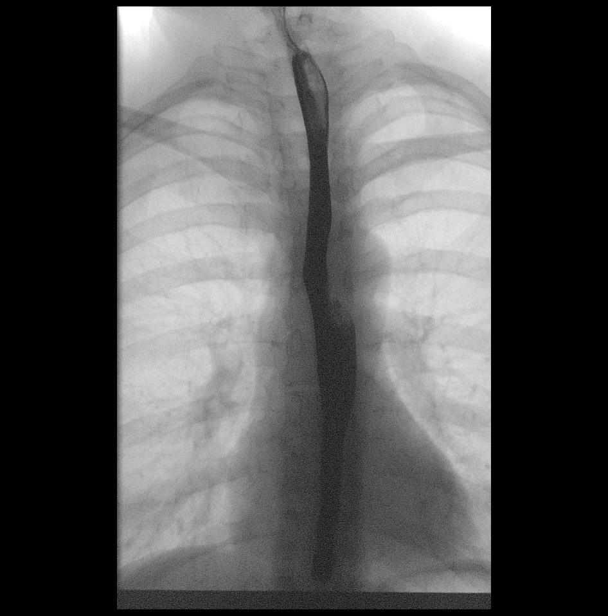

[Series 3: cp_standard · 0.26mm/px · 1 of 1 slices shown (2 of 10)]
[im 1/1]
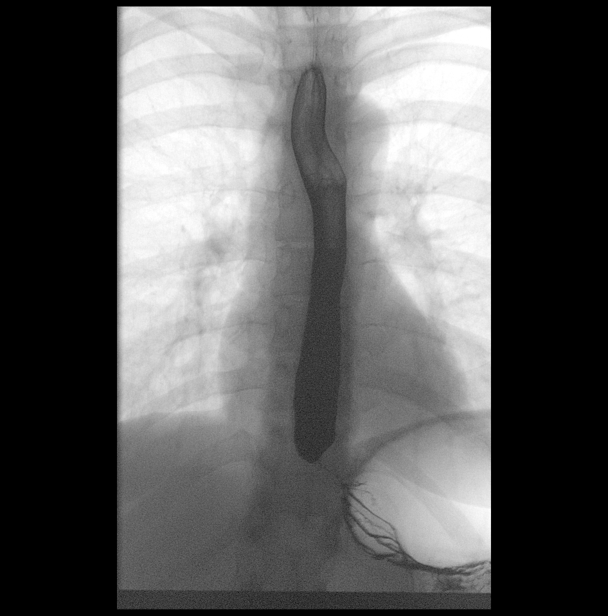

[Series 5: cp_standard · 0.26mm/px · 1 of 1 slices shown (3 of 10)]
[im 1/1]
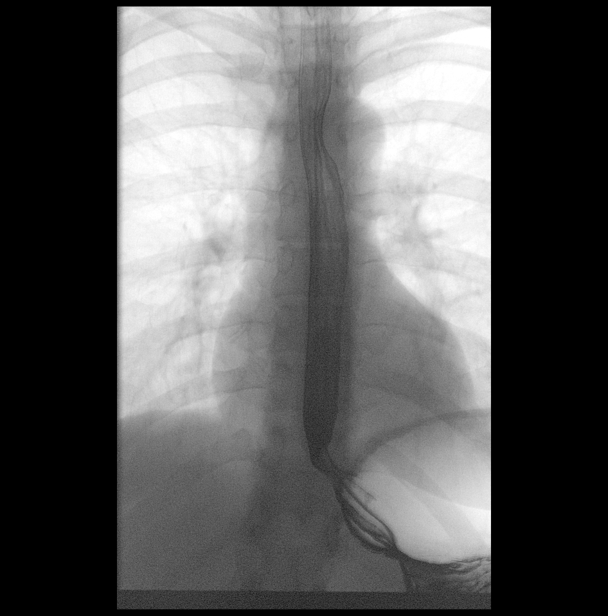

[Series 6: cp_standard · 0.26mm/px · 1 of 1 slices shown (4 of 10)]
[im 1/1]
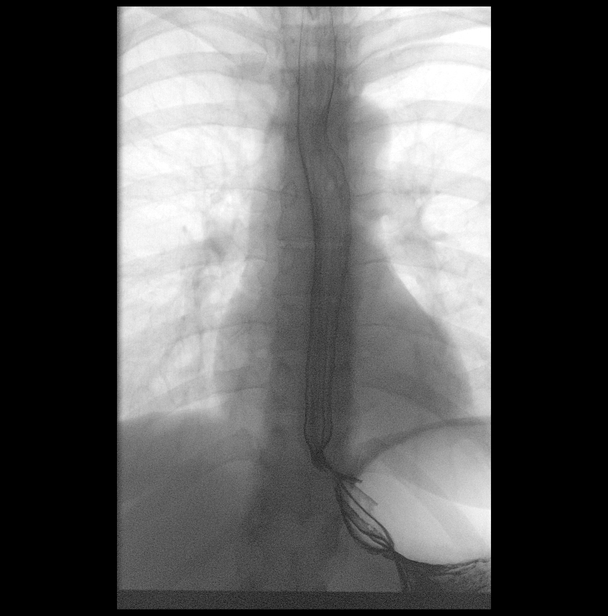

[Series 8: cp_standard · 0.26mm/px · 1 of 1 slices shown (5 of 10)]
[im 1/1]
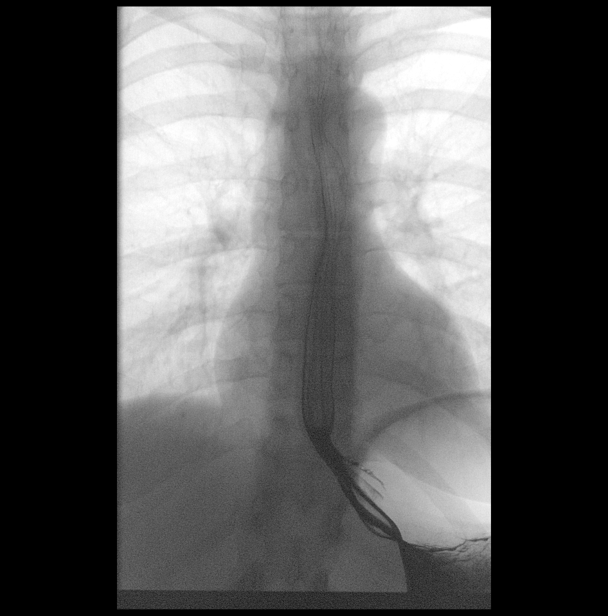

[Series 10: cp_standard · 0.27mm/px · 1 of 1 slices shown (6 of 10)]
[im 1/1]
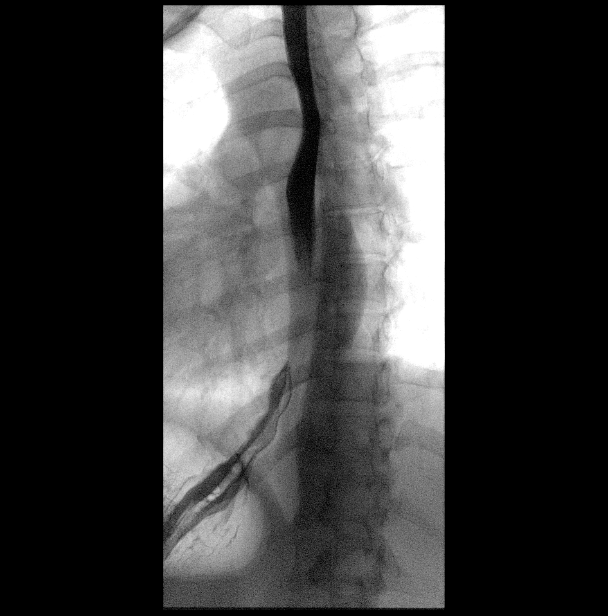

[Series 11: cp_standard · 0.27mm/px · 1 of 2 slices shown (7 of 10)]
[im 1/2]
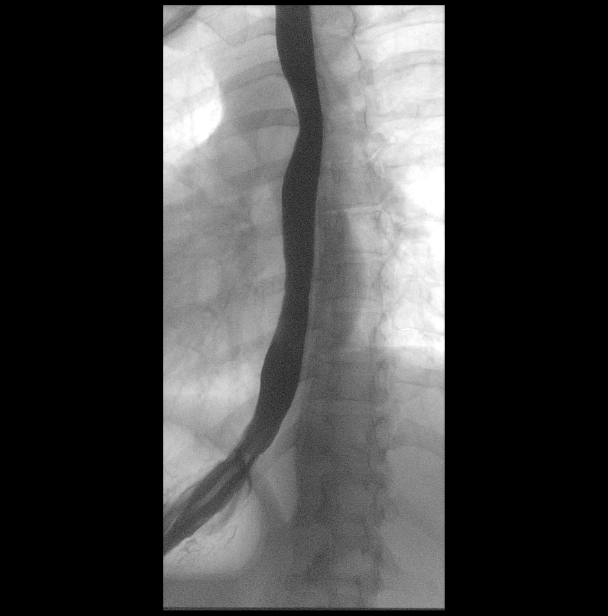

[Series 13: cp_standard · 0.27mm/px · 1 of 1 slices shown (8 of 10)]
[im 1/1]
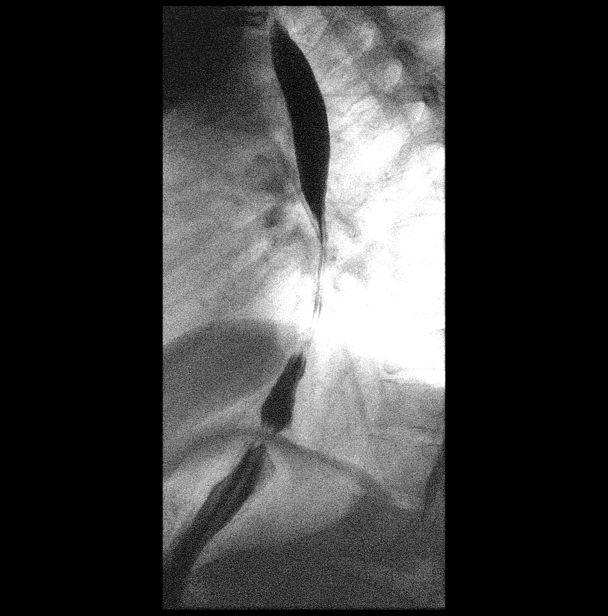

[Series 14: cp_standard · 0.27mm/px · 1 of 1 slices shown (9 of 10)]
[im 1/1]
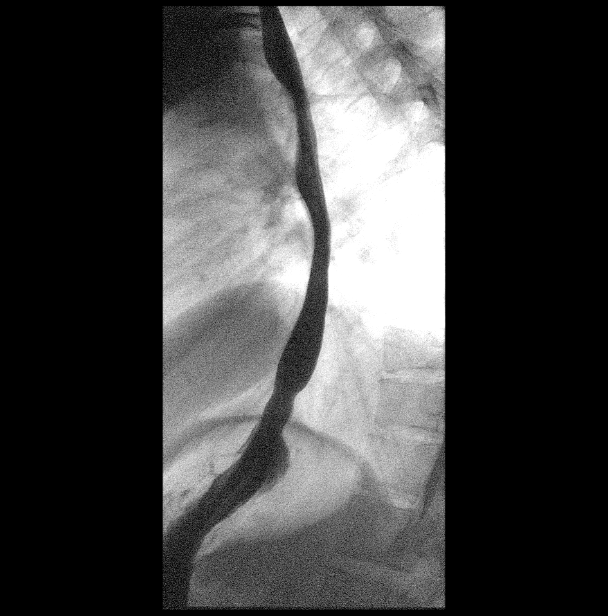

[Series 16: cp_standard · 0.27mm/px · 1 of 1 slices shown (10 of 10)]
[im 1/1]
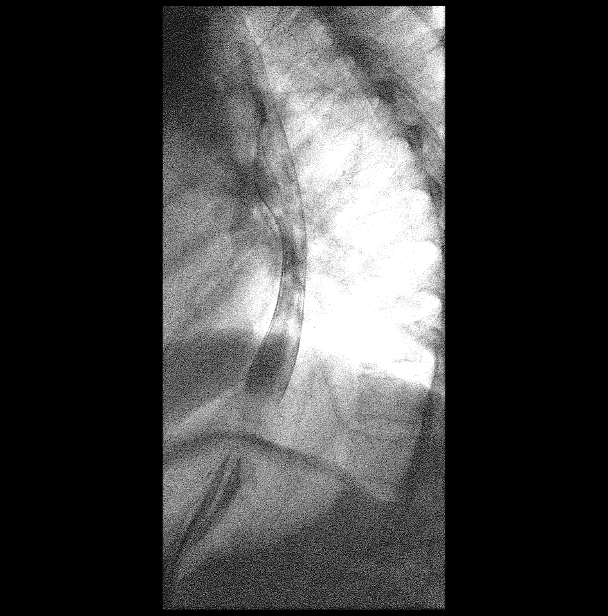

[Series 17: fluoro_barium 2fps_bw · 0.18mm/px · 2 of 8 frames shown (1 of 2)]
[frame 5/8]
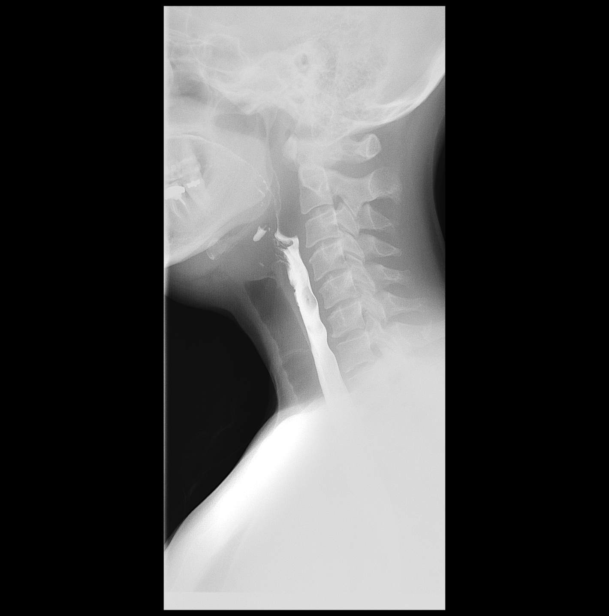
[frame 7/8]
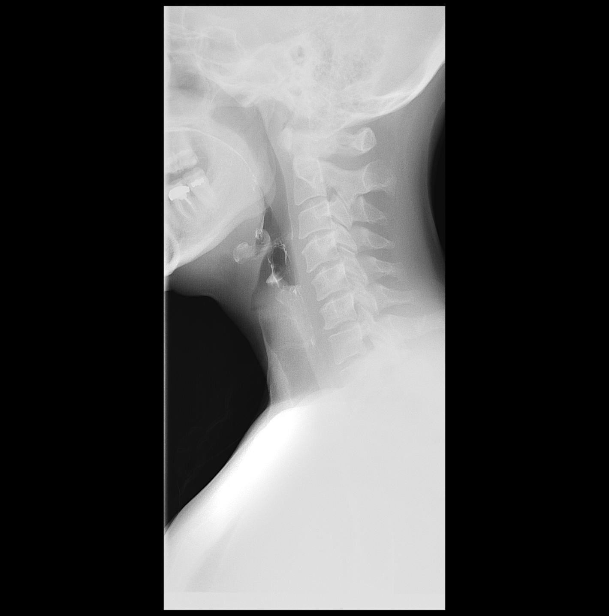

[Series 18: fluoro_barium 2fps_bw · 0.18mm/px · 2 of 16 frames shown (2 of 2)]
[frame 9/16]
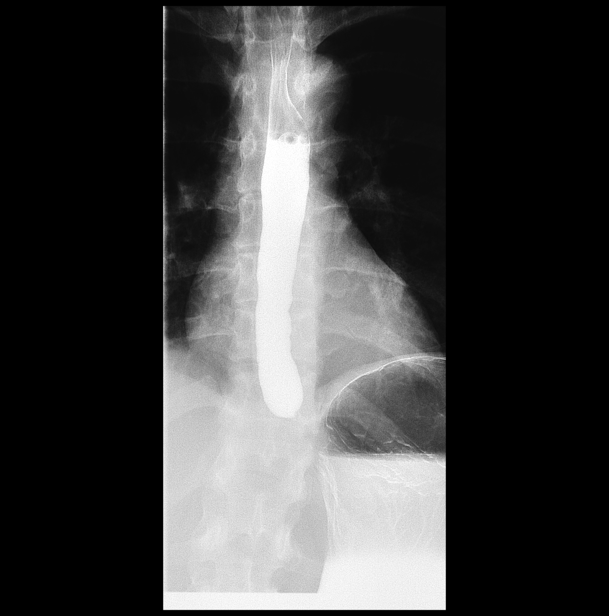
[frame 14/16]
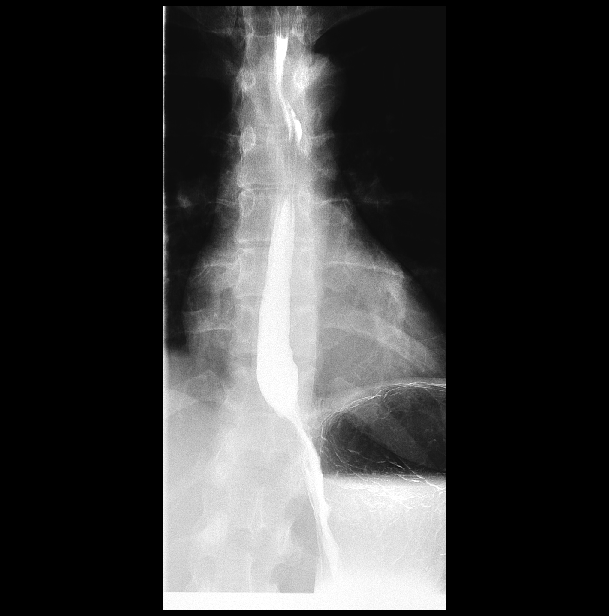

[14 of 23 positions shown; findings below may reference images not displayed]

FINDINGS: No laryngeal penetration or aspiration.

Normal primary esophageal stripping wave without obstructing lesion.

Minimal smooth narrowing distal esophagus. 13 mm barium tablet
traverses beyond this region without difficulty.

No reflux demonstrated with change of patient position or drinking
water.

No mucosal abnormality noted.
IMPRESSION: Minimal smooth narrowing distal esophagus otherwise negative exam.

## 2019-08-05 ENCOUNTER — Ambulatory Visit (INDEPENDENT_AMBULATORY_CARE_PROVIDER_SITE_OTHER): Payer: 59 | Admitting: Internal Medicine

## 2019-08-05 ENCOUNTER — Encounter: Payer: Self-pay | Admitting: Internal Medicine

## 2019-08-05 ENCOUNTER — Other Ambulatory Visit (HOSPITAL_COMMUNITY)
Admission: RE | Admit: 2019-08-05 | Discharge: 2019-08-05 | Disposition: A | Discharge status: Home / Self Care | Payer: 59 | Source: Ambulatory Visit | Attending: Internal Medicine | Admitting: Internal Medicine

## 2019-08-05 ENCOUNTER — Other Ambulatory Visit: Payer: Self-pay

## 2019-08-05 VITALS — BP 122/82 | HR 80 | Temp 97.1°F | Resp 14 | Ht 65.75 in | Wt 146.4 lb

## 2019-08-05 DIAGNOSIS — Z124 Encounter for screening for malignant neoplasm of cervix: Secondary | ICD-10-CM | POA: Insufficient documentation

## 2019-08-05 DIAGNOSIS — R5383 Other fatigue: Secondary | ICD-10-CM | POA: Diagnosis not present

## 2019-08-05 DIAGNOSIS — N952 Postmenopausal atrophic vaginitis: Secondary | ICD-10-CM | POA: Diagnosis not present

## 2019-08-05 DIAGNOSIS — Z1231 Encounter for screening mammogram for malignant neoplasm of breast: Secondary | ICD-10-CM

## 2019-08-05 DIAGNOSIS — J301 Allergic rhinitis due to pollen: Secondary | ICD-10-CM

## 2019-08-05 DIAGNOSIS — Z0000 Encounter for general adult medical examination without abnormal findings: Secondary | ICD-10-CM

## 2019-08-05 DIAGNOSIS — H9313 Tinnitus, bilateral: Secondary | ICD-10-CM | POA: Diagnosis not present

## 2019-08-05 DIAGNOSIS — E785 Hyperlipidemia, unspecified: Secondary | ICD-10-CM

## 2019-08-05 DIAGNOSIS — E1169 Type 2 diabetes mellitus with other specified complication: Secondary | ICD-10-CM

## 2019-08-05 LAB — COMPREHENSIVE METABOLIC PANEL
ALT: 10 U/L (ref 0–35)
AST: 16 U/L (ref 0–37)
Albumin: 4.6 g/dL (ref 3.5–5.2)
Alkaline Phosphatase: 52 U/L (ref 39–117)
BUN: 12 mg/dL (ref 6–23)
CO2: 28 mEq/L (ref 19–32)
Calcium: 9.4 mg/dL (ref 8.4–10.5)
Chloride: 102 mEq/L (ref 96–112)
Creatinine, Ser: 0.84 mg/dL (ref 0.40–1.20)
GFR: 72.09 mL/min (ref >60.00)
Glucose, Bld: 94 mg/dL (ref 70–99)
Potassium: 4.2 mEq/L (ref 3.5–5.1)
Sodium: 137 mEq/L (ref 135–145)
Total Bilirubin: 0.7 mg/dL (ref 0.2–1.2)
Total Protein: 7.3 g/dL (ref 6.0–8.3)

## 2019-08-05 LAB — CBC WITH DIFFERENTIAL/PLATELET
Basophils Absolute: 0.1 10*3/uL (ref 0.0–0.1)
Basophils Relative: 1.3 % (ref 0.0–3.0)
Eosinophils Absolute: 0.3 10*3/uL (ref 0.0–0.7)
Eosinophils Relative: 3.5 % (ref 0.0–5.0)
HCT: 36.4 % (ref 36.0–46.0)
Hemoglobin: 12.4 g/dL (ref 12.0–15.0)
Lymphocytes Relative: 22.1 % (ref 12.0–46.0)
Lymphs Abs: 1.7 10*3/uL (ref 0.7–4.0)
MCHC: 34.1 g/dL (ref 30.0–36.0)
MCV: 95.2 fl (ref 78.0–100.0)
Monocytes Absolute: 0.5 10*3/uL (ref 0.1–1.0)
Monocytes Relative: 6.5 % (ref 3.0–12.0)
Neutro Abs: 5.1 10*3/uL (ref 1.4–7.7)
Neutrophils Relative %: 66.6 % (ref 43.0–77.0)
Platelets: 258 10*3/uL (ref 150.0–400.0)
RBC: 3.82 Mil/uL — ABNORMAL LOW (ref 3.87–5.11)
RDW: 13.4 % (ref 11.5–15.5)
WBC: 7.6 10*3/uL (ref 4.0–10.5)

## 2019-08-05 LAB — LIPID PANEL
Cholesterol: 216 mg/dL — ABNORMAL HIGH (ref 0–200)
HDL: 57.4 mg/dL (ref >39.00)
NonHDL: 158.3
Total CHOL/HDL Ratio: 4
Triglycerides: 203 mg/dL — ABNORMAL HIGH (ref 0.0–149.0)
VLDL: 40.6 mg/dL — ABNORMAL HIGH (ref 0.0–40.0)

## 2019-08-05 LAB — LDL CHOLESTEROL, DIRECT: Direct LDL: 139 mg/dL

## 2019-08-05 LAB — TSH: TSH: 2.65 u[IU]/mL (ref 0.35–4.50)

## 2019-08-05 MED ORDER — ESTROGENS, CONJUGATED 0.625 MG/GM VA CREA: TOPICAL_CREAM | VAGINAL | 12 refills | Status: DC

## 2019-08-05 NOTE — Patient Instructions (Signed)
Try using the vaginal estrogen cream every night for two weeks.  Place a pea sized dab on the area of your introitus (opening) that feels thin or irritated every night for 2 weeks  Then reduce use to two nights per week    Health Maintenance, Female Adopting a healthy lifestyle and getting preventive care are important in promoting health and wellness. Ask your health care provider about:  The right schedule for you to have regular tests and exams.  Things you can do on your own to prevent diseases and keep yourself healthy. What should I know about diet, weight, and exercise? Eat a healthy diet   Eat a diet that includes plenty of vegetables, fruits, low-fat dairy products, and lean protein.  Do not eat a lot of foods that are high in solid fats, added sugars, or sodium. Maintain a healthy weight Body mass index (BMI) is used to identify weight problems. It estimates body fat based on height and weight. Your health care provider can help determine your BMI and help you achieve or maintain a healthy weight. Get regular exercise Get regular exercise. This is one of the most important things you can do for your health. Most adults should:  Exercise for at least 150 minutes each week. The exercise should increase your heart rate and make you sweat (moderate-intensity exercise).  Do strengthening exercises at least twice a week. This is in addition to the moderate-intensity exercise.  Spend less time sitting. Even light physical activity can be beneficial. Watch cholesterol and blood lipids Have your blood tested for lipids and cholesterol at 49 years of age, then have this test every 5 years. Have your cholesterol levels checked more often if:  Your lipid or cholesterol levels are high.  You are older than 49 years of age.  You are at high risk for heart disease. What should I know about cancer screening? Depending on your health history and family history, you may need to have  cancer screening at various ages. This may include screening for:  Breast cancer.  Cervical cancer.  Colorectal cancer.  Skin cancer.  Lung cancer. What should I know about heart disease, diabetes, and high blood pressure? Blood pressure and heart disease  High blood pressure causes heart disease and increases the risk of stroke. This is more likely to develop in people who have high blood pressure readings, are of African descent, or are overweight.  Have your blood pressure checked: ? Every 3-5 years if you are 49-55 years of age. ? Every year if you are 49 years old or older. Diabetes Have regular diabetes screenings. This checks your fasting blood sugar level. Have the screening done:  Once every three years after age 49 if you are at a normal weight and have a low risk for diabetes.  More often and at a younger age if you are overweight or have a high risk for diabetes. What should I know about preventing infection? Hepatitis B If you have a higher risk for hepatitis B, you should be screened for this virus. Talk with your health care provider to find out if you are at risk for hepatitis B infection. Hepatitis C Testing is recommended for:  Everyone born from [birth year] through 1965.  Anyone with known risk factors for hepatitis C. Sexually transmitted infections (STIs)  Get screened for STIs, including gonorrhea and chlamydia, if: ? You are sexually active and are younger than 49 years of age. ? You are older than 49 years  of age and your health care provider tells you that you are at risk for this type of infection. ? Your sexual activity has changed since you were last screened, and you are at increased risk for chlamydia or gonorrhea. Ask your health care provider if you are at risk.  Ask your health care provider about whether you are at high risk for HIV. Your health care provider may recommend a prescription medicine to help prevent HIV infection. If you choose to take  medicine to prevent HIV, you should first get tested for HIV. You should then be tested every 3 months for as long as you are taking the medicine. Pregnancy  If you are about to stop having your period (premenopausal) and you may become pregnant, seek counseling before you get pregnant.  Take 400 to 800 micrograms (mcg) of folic acid every day if you become pregnant.  Ask for birth control (contraception) if you want to prevent pregnancy. Osteoporosis and menopause Osteoporosis is a disease in which the bones lose minerals and strength with aging. This can result in bone fractures. If you are 71 years old or older, or if you are at risk for osteoporosis and fractures, ask your health care provider if you should:  Be screened for bone loss.  Take a calcium or vitamin D supplement to lower your risk of fractures.  Be given hormone replacement therapy (HRT) to treat symptoms of menopause. Follow these instructions at home: Lifestyle  Do not use any products that contain nicotine or tobacco, such as cigarettes, e-cigarettes, and chewing tobacco. If you need help quitting, ask your health care provider.  Do not use street drugs.  Do not share needles.  Ask your health care provider for help if you need support or information about quitting drugs. Alcohol use  Do not drink alcohol if: ? Your health care provider tells you not to drink. ? You are pregnant, may be pregnant, or are planning to become pregnant.  If you drink alcohol: ? Limit how much you use to 0-1 drink a day. ? Limit intake if you are breastfeeding.  Be aware of how much alcohol is in your drink. In the [place] one drink equals one 12 oz bottle of beer (355 mL), one 5 oz glass of wine (148 mL), or one 1 oz glass of hard liquor (44 mL). General instructions  Schedule regular health, dental, and eye exams.  Stay current with your vaccines.  Tell your health care provider if: ? You often feel depressed. ? You have  ever been abused or do not feel safe at home. Summary  Adopting a healthy lifestyle and getting preventive care are important in promoting health and wellness.  Follow your health care provider's instructions about healthy diet, exercising, and getting tested or screened for diseases.  Follow your health care provider's instructions on monitoring your cholesterol and blood pressure. This information is not intended to replace advice given to you by your health care provider. Make sure you discuss any questions you have with your health care provider. Document Revised: 01/29/2018 Document Reviewed: 01/29/2018 Elsevier Patient Education  2020 [employer].

## 2019-08-05 NOTE — Progress Notes (Signed)
Patient ID: Virginia Maldonado, female    DOB: 12-Dec-1970  Age: 49 y.o. MRN: [id number]  The patient is here for annual  wellness examination and management of other chronic and acute problems.  This visit occurred during the SARS-CoV-2 public health emergency.  Safety protocols were in place, including screening questions prior to the visit, additional usage of staff PPE, and extensive cleaning of exam room while observing appropriate contact time as indicated for disinfecting solutions.   Patient has received both doses of the available COVID 19 vaccine with  complications.   since May 5 has been having tinnitus in both ears. Started with stabbing pain , both sides,  Which lasted only a day or two,  But tinitus has not resolved.  No vertigo or vision changes.  Ears feel swollen at times but denies discharge,  No sinus pain , chronic clear rhinorrhea.  Patient continues to mask when outside of the home except when walking in yard or at safe distances from others .  Patient denies any change in mood or development of unhealthy behaviors resuting from the pandemic's restriction of activities and socialization.     The risk factors are reflected in the social history.  The roster of all physicians providing medical care to patient - is listed in the Snapshot section of the chart.  Activities of daily living:  The patient is 100% independent in all ADLs: dressing, toileting, feeding as well as independent mobility  Home safety : The patient has smoke detectors in the home. They wear seatbelts.  There are no firearms at home. There is no violence in the home.   There is no risks for hepatitis, STDs or HIV. There is no   history of blood transfusion. They have no travel history to infectious disease endemic areas of the world.  The patient has seen their dentist in the last six month. They have seen their eye doctor in the last year. They admit to slight hearing difficulty with regard to whispered voices and  some television programs.  They have deferred audiologic testing in the last year.  They do not  have excessive sun exposure. Discussed the need for sun protection: hats, long sleeves and use of sunscreen if there is significant sun exposure.   Diet: the importance of a healthy diet is discussed. They do have a healthy diet.  The benefits of regular aerobic exercise were discussed. She walks 4 times per week ,  20 minutes.   Depression screen: there are no signs or vegative symptoms of depression- irritability, change in appetite, anhedonia, sadness/tearfullness.  The following portions of the patient's history were reviewed and updated as appropriate: allergies, current medications, past family history, past medical history,  past surgical history, past social history  and problem list.  Visual acuity was not assessed per patient preference since she has regular follow up with her ophthalmologist. Hearing and body mass index were assessed and reviewed.   During the course of the visit the patient was educated and counseled about appropriate screening and preventive services including : fall prevention , diabetes screening, nutrition counseling, colorectal cancer screening, and recommended immunizations.    CC: The primary encounter diagnosis was Cervical cancer screening. Diagnoses of Encounter for screening mammogram for malignant neoplasm of breast, Fatigue, unspecified type, Hyperlipidemia associated with type 2 diabetes mellitus ([place]), Encounter for preventive health examination, Tinnitus aurium, bilateral, Atrophic vaginitis, and Seasonal allergic rhinitis due to pollen were also pertinent to this visit.  1) patient has  been having  tinnitus since  She received the COVID vaccination in May  2) she notes vaginal irritation that lasts for up to two weeks after intercourse with husband.   No longer wears things because perineal area becomes irritated   History Makahla has a past medical history  of COPD (chronic obstructive pulmonary disease) (HCC).   She has a past surgical history that includes Hernia repair and Breast excisional biopsy (Right, 1995).   Her family history is not on file.She reports that she quit smoking about 23 years ago. Her smoking use included cigarettes. She has never used smokeless tobacco. She reports current alcohol use. She reports that she does not use drugs.  Outpatient Medications Prior to Visit  Medication Sig Dispense Refill   fexofenadine (ALLEGRA ALLERGY) 180 MG tablet Take 180 mg by mouth daily.      polyethylene glycol powder (GLYCOLAX/MIRALAX) powder take as directed for colonoscopy prep 255 g 0   cetirizine (ZYRTEC) 10 MG tablet Take 10 mg by mouth daily.     montelukast (SINGULAIR) 10 MG tablet Take 1 tablet (10 mg total) by mouth at bedtime. 30 tablet 3   pantoprazole (PROTONIX) 40 MG tablet TAKE 1 TABLET BY MOUTH ONCE DAILY 90 tablet 1   No facility-administered medications prior to visit.    Review of Systems   Patient denies headache, fevers, malaise, unintentional weight loss, skin rash, eye pain, sinus congestion and sinus pain, sore throat, dysphagia,  hemoptysis , cough, dyspnea, wheezing, chest pain, palpitations, orthopnea, edema, abdominal pain, nausea, melena, diarrhea, constipation, flank pain, dysuria, hematuria, urinary  Frequency, nocturia, numbness, tingling, seizures,  Focal weakness, Loss of consciousness,  Tremor, insomnia, depression, anxiety, and suicidal ideation.      Objective:  BP 122/82 (BP Location: Left Arm, Patient Position: Sitting, Cuff Size: Normal)    Pulse 80    Temp (!) 97.1 F (36.2 C) (Temporal)    Resp 14    Ht 5' 5.75" (1.67 m)    Wt 146 lb 6.4 oz (66.4 kg)    SpO2 99%    BMI 23.81 kg/m   Physical Exam  General Appearance:    Alert, cooperative, no distress, appears stated age  Head:    Normocephalic, without obvious abnormality, atraumatic  Eyes:    PERRL, conjunctiva/corneas clear, EOM's  intact, fundi    benign, both eyes  Ears:    Normal TM's and external ear canals, both ears  Nose:   Nares normal, septum midline, mucosa normal, no drainage    or sinus tenderness  Throat:   Lips, mucosa, and tongue normal; teeth and gums normal  Neck:   Supple, symmetrical, trachea midline, no adenopathy;    thyroid:  no enlargement/tenderness/nodules; no carotid   bruit or JVD  Back:     Symmetric, no curvature, ROM normal, no CVA tenderness  Lungs:     Clear to auscultation bilaterally, respirations unlabored  Chest Wall:    No tenderness or deformity   Heart:    Regular rate and rhythm, S1 and S2 normal, no murmur, rub   or gallop  Breast Exam:    No tenderness, masses, or nipple abnormality  Abdomen:     Soft, non-tender, bowel sounds active all four quadrants,    no masses, no organomegaly  Genitalia:    Pelvic: cervix normal in appearance, external genitalia normal, no adnexal masses or tenderness, no cervical motion tenderness, rectovaginal septum normal, uterus normal size, shape, and consistency and vagina normal without discharge  Extremities:   Extremities normal, atraumatic, no cyanosis or edema  Pulses:   2+ and symmetric all extremities  Skin:   Skin color, texture, turgor normal, no rashes or lesions  Lymph nodes:   Cervical, supraclavicular, and axillary nodes normal  Neurologic:   CNII-XII intact, normal strength, sensation and reflexes    throughout     Assessment & Plan:   Problem List Items Addressed This Visit      Unprioritized   Allergic rhinitis    Continue zyrtec       Atrophic vaginitis    Trial of estrogen cream offered       Encounter for preventive health examination    age appropriate education and counseling updated, referrals for preventative services and immunizations addressed, dietary and smoking counseling addressed, most recent labs reviewed.  I have personally reviewed and have noted:  1) the patient's medical and social history 2)  The pt's use of alcohol, tobacco, and illicit drugs 3) The patient's current medications and supplements 4) Functional ability including ADL's, fall risk, home safety risk, hearing and visual impairment 5) Diet and physical activities 6) Evidence for depression or mood disorder 7) The patient's height, weight, and BMI have been recorded in the chart  I have made referrals, and provided counseling and education based on review of the above      Tinnitus aurium, bilateral    Symptoms started after receiving the Pfizer  COVID 19 vaccine.  She notes some hearing loss but denies headaches,  dizziness and vision changes       Other Visit Diagnoses    Cervical cancer screening    -  Primary   Relevant Orders   Cytology - PAP( [place]) (Completed)   Encounter for screening mammogram for malignant neoplasm of breast       Relevant Orders   MM 3D SCREEN BREAST BILATERAL   Fatigue, unspecified type       Relevant Orders   Comprehensive metabolic panel (Completed)   TSH (Completed)   CBC with Differential/Platelet (Completed)   Hyperlipidemia associated with type 2 diabetes mellitus (HCC)       Relevant Orders   Lipid panel (Completed)      I have discontinued Lateefa S. Kestner's cetirizine, montelukast, and pantoprazole. I am also having her start on conjugated estrogens. Additionally, I am having her maintain her polyethylene glycol powder and fexofenadine.  Meds ordered this encounter  Medications   conjugated estrogens (PREMARIN) vaginal cream    Sig: Apply sparingly to affected area    Dispense:  42.5 g    Refill:  12    Medications Discontinued During This Encounter  Medication Reason   cetirizine (ZYRTEC) 10 MG tablet Patient Preference   montelukast (SINGULAIR) 10 MG tablet Patient Preference   pantoprazole (PROTONIX) 40 MG tablet Patient has not taken in last 30 days    Follow-up: No follow-ups on file.   Sherlene Shams, MD

## 2019-08-06 DIAGNOSIS — H9313 Tinnitus, bilateral: Secondary | ICD-10-CM | POA: Insufficient documentation

## 2019-08-06 DIAGNOSIS — N952 Postmenopausal atrophic vaginitis: Secondary | ICD-10-CM | POA: Insufficient documentation

## 2019-08-06 LAB — CYTOLOGY - PAP
Comment: NEGATIVE
Diagnosis: NEGATIVE
Diagnosis: REACTIVE
High risk HPV: NEGATIVE

## 2019-08-06 NOTE — Assessment & Plan Note (Signed)
Trial of estrogen cream offered

## 2019-08-06 NOTE — Assessment & Plan Note (Signed)

## 2019-08-06 NOTE — Assessment & Plan Note (Addendum)
Symptoms started after receiving the Pfizer  COVID 19 vaccine.  She notes some hearing loss but denies headaches,  dizziness and vision changes

## 2019-08-06 NOTE — Assessment & Plan Note (Signed)
Continue zyrtec.  

## 2020-06-27 ENCOUNTER — Telehealth: Payer: Self-pay

## 2020-06-27 NOTE — Telephone Encounter (Signed)
Patient has appointment tomorrow with FNP.      [facility] Night - Cl TELEPHONE ADVICE RECORD Access[facility]urse Patient Name: Quintavia Dam Gender: Female DOB: [birth year] Age: 50 Y 9 M 23 D Return Phone Number: [phone] (Primary) Address: City/ State/ Zip: [place] [address] Client [facility] Night - Cl Client Site [facility] - Night Physician Duncan Dull - MD Contact Type Call Who Is Calling Patient / Member / Family / Caregiver Call Type Triage / Clinical Relationship To Patient Self Return Phone Number [phone] (Primary) Chief Complaint Poison [place], [place] Or [facility] Reason for Call Request to Schedule Office Appointment Initial Comment Caller states she wants to know if there is a virtual or in person appt today. She has poison ivy. Was advised to talk to office for an appt. Translation No Nurse Assessment Nurse: Annye English, RN, Denise Date/Time (Eastern Time): 06/27/2020 8:24:48 AM Confirm and document reason for call. If symptomatic, describe symptoms. ---She has poison ivy that is widespread. Does the patient have any new or worsening symptoms? ---Yes Will a triage be completed? ---Yes Related visit to physician within the last 2 weeks? ---No Does the PT have any chronic conditions? (i.e. diabetes, asthma, this includes High risk factors for pregnancy, etc.) ---No Is the patient pregnant or possibly pregnant? (Ask all females between the ages of 2-55) ---No Is this a behavioral health or substance abuse call? ---No Guidelines Guideline Title Affirmed Question Affirmed Notes Nurse Date/Time (Eastern Time) Poison Ivy - [place] - [place] Large blisters or oozing sores Carmon, RN, Angelique Blonder 06/27/2020 8:25:23 AM Disp. Time Lamount Cohen Time) Disposition Final User 06/27/2020 8:00:22 AM Send To RN Personal Lane Hacker, RN, Windy PLEASE NOTE: All timestamps contained within this report are  represented as [place] Standard Time. CONFIDENTIALTY NOTICE: This fax transmission is intended only for the addressee. It contains information that is legally privileged, confidential or otherwise protected from use or disclosure. If you are not the intended recipient, you are strictly prohibited from reviewing, disclosing, copying using or disseminating any of this information or taking any action in reliance on or regarding this information. If you have received this fax in error, please notify us immediately by telephone so that we can arrange for its return to [place]. Phone: [phone], Toll-Free: [phone], Fax: [phone] Page: 2 of 2 Call Id: [id number] 06/27/2020 8:27:34 AM See HCP within 4 Hours (or PCP triage) Yes Carmon, RN, Leighton Ruff Disagree/Comply Comply Caller Understands Yes PreDisposition Call Doctor Care Advice Given Per Guideline SEE HCP (OR PCP TRIAGE) WITHIN 4 HOURS: LOCAL COLD FOR ITCHING: * Put a cold wet cloth on the itchy area for 20 minutes, or massage it with an ice cube. ANTIHISTAMINE MEDICINES FOR ITCHING: * You can take DIPHENHYDRAMINE (Benadryl) for itching that KEEPS YOU AWAKE at night. It can cause sleepiness and may help with the itching. CALL BACK IF: * You become worse CARE ADVICE given per Poison Ivy - [place] - [place] (Adult) guideline. Referrals REFERRED TO PCP OFFICE

## 2020-06-28 ENCOUNTER — Encounter: Payer: Self-pay | Admitting: Adult Health

## 2020-06-28 ENCOUNTER — Telehealth (INDEPENDENT_AMBULATORY_CARE_PROVIDER_SITE_OTHER): Payer: 59 | Admitting: Adult Health

## 2020-06-28 VITALS — Ht 65.75 in | Wt 150.0 lb

## 2020-06-28 DIAGNOSIS — L258 Unspecified contact dermatitis due to other agents: Secondary | ICD-10-CM

## 2020-06-28 MED ORDER — PREDNISONE 10 MG (21) PO TBPK: ORAL_TABLET | ORAL | 1 refills | Status: DC

## 2020-06-28 NOTE — Progress Notes (Signed)
Virtual Visit via Video Note  I connected with [name] on 06/28/20 at  7:30 AM EDT by a video enabled telemedicine application and verified that I am speaking with the correct person using two identifiers. Parties involved in visit as below:   Location: Patient: at home  Provider: Provider: Provider's office at  [facility], [place].      I discussed the limitations of evaluation and management by telemedicine and the availability of in person appointments. The patient expressed understanding and agreed to proceed.   I discussed the assessment and treatment plan with the patient. The patient was provided an opportunity to ask questions and all were answered. The patient agreed with the plan and demonstrated an understanding of the instructions.   The patient was advised to call back or seek an in-person evaluation if the symptoms worsen or if the condition fails to improve as anticipated.   Jairo Ben, FNP   Subjective:    Patient ID: Virginia Maldonado, female    DOB: [birth year], 50 y.o.   MRN: [id number]  Chief Complaint  Patient presents with  . Poison Ivy    Pt c/o poison ivy along limbs and upper torso x1week. Using OTC hydrocortisone and soap for Poison Ivy.    HPI Patient is in today for itchy rash on torso and bilateral arms. She came in contact with poison oak outside. Connected by virtual video visit.  Patient  denies any fever, body aches,chills, chest pain, shortness of breath, nausea, vomiting, or diarrhea.    Past Medical History:  Diagnosis Date  . COPD (chronic obstructive pulmonary disease) (HCC)     Past Surgical History:  Procedure Laterality Date  . 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 HERNIA REPAIR      History reviewed. No pertinent family history.  Social History   Socioeconomic History  . Marital status: Married    Spouse name: Not on file  . Number of children: Not on file  .  Years of education: Not on file  . Highest education level: Not on file  Occupational History  . Not on file  Tobacco Use  . Smoking status: Former Smoker    Types: Cigarettes    Quit date: 01/23/1996    Years since quitting: 24.4  . Smokeless tobacco: Never Used  Vaping Use  . Vaping Use: Never used  Substance and Sexual Activity  . Alcohol use: Yes  . Drug use: No  . Sexual activity: Not on file  Other Topics Concern  . Not on file  Social History Narrative  . Not on file   Social Determinants of Health   Financial Resource Strain: Not on file  Food Insecurity: Not on file  Transportation Needs: Not on file  Physical Activity: Not on file  Stress: Not on file  Social Connections: Not on file  Intimate Partner Violence: Not on file    Outpatient Medications Prior to Visit  Medication Sig Dispense Refill  . conjugated estrogens (PREMARIN) vaginal cream Apply sparingly to affected area 42.5 g 12  . fexofenadine (ALLEGRA) 180 MG tablet Take 180 mg by mouth daily.     . polyethylene glycol powder (GLYCOLAX/MIRALAX) powder take as directed for colonoscopy prep 255 g 0   No facility-administered medications prior to visit.    Allergies  Allergen Reactions  . Prilosec [Omeprazole] Hives  . Sertraline Hcl Rash    Review of Systems  Constitutional: Negative.  HENT: Negative.   Respiratory: Negative.   Cardiovascular: Negative.   Genitourinary: Negative.   Musculoskeletal: Negative.   Skin: Positive for color change and rash. Negative for pallor and wound.  Neurological: Negative.   Psychiatric/Behavioral: Negative.        Objective:    Physical Exam HENT:     Head: Normocephalic.  Skin:    Findings: Erythema and rash present.  Neurological:     Mental Status: She is alert and oriented to person, place, and time.    Able to see rash on arms on camera does have the appearance of poison oak.    Patient is alert and oriented and responsive to questions  Engages in conversation with provider. Speaks in full sentences without any pauses without any shortness of breath or distress.  Ht 5' 5.75" (1.67 m)   Wt 150 lb (68 kg)   LMP 06/21/2020   BMI 24.40 kg/m  Wt Readings from Last 3 Encounters:  06/28/20 150 lb (68 kg)  08/05/19 146 lb 6.4 oz (66.4 kg)  05/24/17 148 lb 9.6 oz (67.4 kg)    Health Maintenance Due  Topic Date Due  . URINE MICROALBUMIN  Never done  . HIV Screening  Never done  . Hepatitis C Screening  Never done  . COLONOSCOPY (Pts 50-91yrs Insurance coverage will need to be confirmed)  Never done  . COVID-19 Vaccine (3 - Booster for Pfizer series) 01/15/2020    There are no preventive care reminders to display for this patient.   Lab Results  Component Value Date   TSH 2.65 08/05/2019   Lab Results  Component Value Date   WBC 7.6 08/05/2019   HGB 12.4 08/05/2019   HCT 36.4 08/05/2019   MCV 95.2 08/05/2019   PLT 258.0 08/05/2019   Lab Results  Component Value Date   NA 137 08/05/2019   K 4.2 08/05/2019   CO2 28 08/05/2019   GLUCOSE 94 08/05/2019   BUN 12 08/05/2019   CREATININE 0.84 08/05/2019   BILITOT 0.7 08/05/2019   ALKPHOS 52 08/05/2019   AST 16 08/05/2019   ALT 10 08/05/2019   PROT 7.3 08/05/2019   ALBUMIN 4.6 08/05/2019   CALCIUM 9.4 08/05/2019   ANIONGAP 7 03/13/2016   GFR 72.09 08/05/2019   Lab Results  Component Value Date   CHOL 216 (H) 08/05/2019   Lab Results  Component Value Date   HDL 57.40 08/05/2019   Lab Results  Component Value Date   LDLCALC 133 (H) 05/24/2017   Lab Results  Component Value Date   TRIG 203.0 (H) 08/05/2019   Lab Results  Component Value Date   CHOLHDL 4 08/05/2019   No results found for: HGBA1C     Assessment & Plan:   Problem List Items Addressed This Visit   None   Visit Diagnoses    Contact dermatitis due to other agent, unspecified contact dermatitis type    -  Primary   Relevant Medications   predniSONE (STERAPRED UNI-PAK 21 TAB) 10  MG (21) TBPK tablet     recommend cool showers, cool compresses, zyrtec for itching during day per package and ok Benadryl at night if itching per package.  Calamine or IVY rest per package to areas advised. Call back if any signs of infection, increasing warmth , drainage or spreading erythema,   Meds ordered this encounter  Medications  . predniSONE (STERAPRED UNI-PAK 21 TAB) 10 MG (21) TBPK tablet    Sig: PO: Take 6 tablets on  day 1:Take 5 tablets day 2:Take 4 tablets day 3: Take 3 tablets day 4:Take 2 tablets day five: 5 Take 1 tablet day 6    Dispense:  21 tablet    Refill:  1    Advised in person evaluation at anytime is advised if any symptoms do not improve, worsen or change at any given time.  Red Flags discussed. The patient was given clear instructions to go to ER or return to medical center if any red flags develop, symptoms do not improve, worsen or new problems develop. They verbalized understanding. Jairo Ben, FNP

## 2020-09-14 ENCOUNTER — Other Ambulatory Visit: Payer: Self-pay

## 2020-09-14 ENCOUNTER — Ambulatory Visit (INDEPENDENT_AMBULATORY_CARE_PROVIDER_SITE_OTHER): Payer: 59 | Admitting: Internal Medicine

## 2020-09-14 ENCOUNTER — Encounter: Payer: Self-pay | Admitting: Internal Medicine

## 2020-09-14 ENCOUNTER — Ambulatory Visit (INDEPENDENT_AMBULATORY_CARE_PROVIDER_SITE_OTHER): Payer: 59

## 2020-09-14 VITALS — BP 100/68 | HR 79 | Temp 96.5°F | Resp 13 | Ht 65.75 in | Wt 149.4 lb

## 2020-09-14 DIAGNOSIS — N921 Excessive and frequent menstruation with irregular cycle: Secondary | ICD-10-CM

## 2020-09-14 DIAGNOSIS — R140 Abdominal distension (gaseous): Secondary | ICD-10-CM

## 2020-09-14 DIAGNOSIS — Z113 Encounter for screening for infections with a predominantly sexual mode of transmission: Secondary | ICD-10-CM

## 2020-09-14 DIAGNOSIS — K5900 Constipation, unspecified: Secondary | ICD-10-CM | POA: Diagnosis not present

## 2020-09-14 DIAGNOSIS — Z0000 Encounter for general adult medical examination without abnormal findings: Secondary | ICD-10-CM

## 2020-09-14 DIAGNOSIS — K581 Irritable bowel syndrome with constipation: Secondary | ICD-10-CM

## 2020-09-14 DIAGNOSIS — E782 Mixed hyperlipidemia: Secondary | ICD-10-CM | POA: Diagnosis not present

## 2020-09-14 DIAGNOSIS — Z1231 Encounter for screening mammogram for malignant neoplasm of breast: Secondary | ICD-10-CM | POA: Diagnosis not present

## 2020-09-14 DIAGNOSIS — R69 Illness, unspecified: Secondary | ICD-10-CM | POA: Diagnosis not present

## 2020-09-14 NOTE — Progress Notes (Signed)
Patient ID: Virginia Maldonado, female    DOB: [birth year]  Age: 50 y.o. MRN: [id number]  The patient is here for annual preventive examination and management of other chronic and acute problems.   The risk factors are reflected in the social history.  The roster of all physicians providing medical care to patient - is listed in the Snapshot section of the chart.  Activities of daily living:  The patient is 100% independent in all ADLs: dressing, toileting, feeding as well as independent mobility  Home safety : The patient has smoke detectors in the home. They wear seatbelts.  There are no firearms at home. There is no violence in the home.   There is no risks for hepatitis, STDs or HIV. There is no   history of blood transfusion. They have no travel history to infectious disease endemic areas of the world.  The patient has seen their dentist in the last six month. They have seen their eye doctor in the last year. They deny  hearing difficulty with regard to whispered voices and some television programs.  They have deferred audiologic testing in the last year.  They do not  have excessive sun exposure. Discussed the need for sun protection: hats, long sleeves and use of sunscreen if there is significant sun exposure.   Diet: the importance of a healthy diet is discussed. They do have a healthy diet.  The benefits of regular aerobic exercise were discussed. She walks 4 times per week ,  20 minutes.   Depression screen: there are no signs or vegative symptoms of depression- irritability, change in appetite, anhedonia, sadness/tearfullness.  The following portions of the patient's history were reviewed and updated as appropriate: allergies, current medications, past family history, past medical history,  past surgical history, past social history  and problem list.  Visual acuity was not assessed per patient preference since she has regular follow up with her ophthalmologist. Hearing and body mass index  were assessed and reviewed.   During the course of the visit the patient was educated and counseled about appropriate screening and preventive services including : fall prevention , diabetes screening, nutrition counseling, colorectal cancer screening, and recommended immunizations.    CC: The primary encounter diagnosis was Abdominal distension. Diagnoses of Encounter for screening mammogram for malignant neoplasm of breast, Screen for STD (sexually transmitted disease), Menorrhagia with irregular cycle, Moderate mixed hyperlipidemia not requiring statin therapy, Irritable bowel syndrome with constipation, and Encounter for preventive health examination were also pertinent to this visit.  1) chronic constipation :  using miralax every other day to maintain regularity,  daily use of miralax  causes  liquid stools . History of umbilical hernia repair with mesh x 2 places,  notes abd becomes very firm and distended before BMS   2) irregular menses occurred on July 1  wit a weak of heavy menstruation with mild cramping , was bleeding through pads and tampons.   However, she Has not bled in 2 weeks.  No hot flashes.   3)  Weight gain;  diet reviewed and excellent.   6 to 7 serving  of vegetables daily .   Exercises  4 to 5 days per week 45 min or cardio and 1 hour of strength training  . Frustrated at weight fluctuations.  Weight will vary by 5 lbs often overnight .     History [place] has a past medical history of COPD (chronic obstructive pulmonary disease) (HCC).   She has a past surgical  history that includes Hernia repair and Breast excisional biopsy (Right, 1995).   Her family history is not on file.She reports that she quit smoking about 24 years ago. Her smoking use included cigarettes. She has never used smokeless tobacco. She reports current alcohol use. She reports that she does not use drugs.  Outpatient Medications Prior to Visit  Medication Sig Dispense Refill   fexofenadine (ALLEGRA) 180  MG tablet Take 180 mg by mouth daily.      polyethylene glycol (MIRALAX) 17 g packet Take 17 g by mouth daily as needed.     conjugated estrogens (PREMARIN) vaginal cream Apply sparingly to affected area (Patient not taking: Reported on 09/14/2020) 42.5 g 12   polyethylene glycol powder (GLYCOLAX/MIRALAX) powder take as directed for colonoscopy prep 255 g 0   predniSONE (STERAPRED UNI-PAK 21 TAB) 10 MG (21) TBPK tablet PO: Take 6 tablets on day 1:Take 5 tablets day 2:Take 4 tablets day 3: Take 3 tablets day 4:Take 2 tablets day five: 5 Take 1 tablet day 6 (Patient not taking: Reported on 09/14/2020) 21 tablet 1   No facility-administered medications prior to visit.    Review of Systems  Patient denies headache, fevers, malaise, unintentional weight loss, skin rash, eye pain, sinus congestion and sinus pain, sore throat, dysphagia,  hemoptysis , cough, dyspnea, wheezing, chest pain, palpitations, orthopnea, edema, abdominal pain, nausea, melena, diarrhea, constipation, flank pain, dysuria, hematuria, urinary  Frequency, nocturia, numbness, tingling, seizures,  Focal weakness, Loss of consciousness,  Tremor, insomnia, depression, anxiety, and suicidal ideation.     Objective:  BP 100/68 (BP Location: Left Arm, Patient Position: Sitting, Cuff Size: Normal)   Pulse 79   Temp (!) 96.5 F (35.8 C) (Temporal)   Resp 13   Ht 5' 5.75" (1.67 m)   Wt 149 lb 6.4 oz (67.8 kg)   SpO2 99%   BMI 24.30 kg/m   Physical Exam  General appearance: alert, cooperative and appears stated age Head: Normocephalic, without obvious abnormality, atraumatic Eyes: conjunctivae/corneas clear. PERRL, EOM's intact. Fundi benign. Ears: normal TM's and external ear canals both ears Nose: Nares normal. Septum midline. Mucosa normal. No drainage or sinus tenderness. Throat: lips, mucosa, and tongue normal; teeth and gums normal Neck: no adenopathy, no carotid bruit, no JVD, supple, symmetrical, trachea midline and thyroid  not enlarged, symmetric, no tenderness/mass/nodules Lungs: clear to auscultation bilaterally Breasts: normal appearance, no masses or tenderness Heart: regular rate and rhythm, S1, S2 normal, no murmur, click, rub or gallop Abdomen: soft, non-tender; bowel sounds normal; no masses,  no organomegaly Extremities: extremities normal, atraumatic, no cyanosis or edema Pulses: 2+ and symmetric Skin: Skin color, texture, turgor normal. No rashes or lesions Neurologic: Alert and oriented X 3, normal strength and tone. Normal symmetric reflexes. Normal coordination and gait.     Assessment & Plan:   Problem List Items Addressed This Visit       Unprioritized   Encounter for preventive health examination    age appropriate education and counseling updated, referrals for preventative services and immunizations addressed, dietary and smoking counseling addressed, most recent labs reviewed.  I have personally reviewed and have noted:   1) the patient's medical and social history 2) The pt's use of alcohol, tobacco, and illicit drugs 3) The patient's current medications and supplements 4) Functional ability including ADL's, fall risk, home safety risk, hearing and visual impairment 5) Diet and physical activities 6) Evidence for depression or mood disorder 7) The patient's height, weight, and BMI have  been recorded in the chart   I have made referrals, and provided counseling and education based on review of the above       Irritable bowel syndrome (IBS)    She has recurrent episodes of constipation complicated by abdominal distension and weight gain. A plain abdominal film done today notes a marked amount of stool .  Will recommend a trial of amitiza 8 mcg bid.        Relevant Medications   polyethylene glycol (MIRALAX) 17 g packet   lubiprostone (AMITIZA) 8 MCG capsule   Other Visit Diagnoses     Abdominal distension    -  Primary   Relevant Orders   DG Abd 1 View (Completed)    Comprehensive metabolic panel (Completed)   Encounter for screening mammogram for malignant neoplasm of breast       Relevant Orders   MM 3D SCREEN BREAST BILATERAL   Screen for STD (sexually transmitted disease)       Relevant Orders   HIV Antibody (routine testing w rflx) (Completed)   Hepatitis C antibody (Completed)   Menorrhagia with irregular cycle       Relevant Orders   Follicle stimulating hormone (Completed)   LH (Completed)   Moderate mixed hyperlipidemia not requiring statin therapy       Relevant Orders   Lipid panel (Completed)   TSH (Completed)       I have discontinued Nikkol S. Monks's polyethylene glycol powder, conjugated estrogens, and predniSONE. I am also having her start on lubiprostone. Additionally, I am having her maintain her fexofenadine and polyethylene glycol.  Meds ordered this encounter  Medications   lubiprostone (AMITIZA) 8 MCG capsule    Sig: Take 1 capsule (8 mcg total) by mouth 2 (two) times daily with a meal.    Dispense:  60 capsule    Refill:  1    Medications Discontinued During This Encounter  Medication Reason   conjugated estrogens (PREMARIN) vaginal cream Prescription never filled   polyethylene glycol powder (GLYCOLAX/MIRALAX) powder    predniSONE (STERAPRED UNI-PAK 21 TAB) 10 MG (21) TBPK tablet     Follow-up: No follow-ups on file.   Sherlene Shams, MD

## 2020-09-14 NOTE — Patient Instructions (Addendum)
Your annual mammogram has been ordered.  You are encouraged (required) to call to make your appointment at [place]  [phone]    Check with your insurance about the cologuard test as a covered screening test for colon cancer.

## 2020-09-15 LAB — COMPREHENSIVE METABOLIC PANEL
ALT: 8 U/L (ref 0–35)
AST: 14 U/L (ref 0–37)
Albumin: 4.7 g/dL (ref 3.5–5.2)
Alkaline Phosphatase: 40 U/L (ref 39–117)
BUN: 13 mg/dL (ref 6–23)
CO2: 27 mEq/L (ref 19–32)
Calcium: 10.2 mg/dL (ref 8.4–10.5)
Chloride: 100 mEq/L (ref 96–112)
Creatinine, Ser: 0.91 mg/dL (ref 0.40–1.20)
GFR: 73.81 mL/min (ref >60.00)
Glucose, Bld: 85 mg/dL (ref 70–99)
Potassium: 3.7 mEq/L (ref 3.5–5.1)
Sodium: 137 mEq/L (ref 135–145)
Total Bilirubin: 0.7 mg/dL (ref 0.2–1.2)
Total Protein: 7.2 g/dL (ref 6.0–8.3)

## 2020-09-15 LAB — LIPID PANEL
Cholesterol: 237 mg/dL — ABNORMAL HIGH (ref 0–200)
HDL: 61.3 mg/dL (ref >39.00)
NonHDL: 175.27
Total CHOL/HDL Ratio: 4
Triglycerides: 253 mg/dL — ABNORMAL HIGH (ref 0.0–149.0)
VLDL: 50.6 mg/dL — ABNORMAL HIGH (ref 0.0–40.0)

## 2020-09-15 LAB — LUTEINIZING HORMONE: LH: 4.27 m[IU]/mL

## 2020-09-15 LAB — HEPATITIS C ANTIBODY
Hepatitis C Ab: NONREACTIVE
SIGNAL TO CUT-OFF: 0.01 (ref <1.00)

## 2020-09-15 LAB — HIV ANTIBODY (ROUTINE TESTING W REFLEX): HIV 1&2 Ab, 4th Generation: NONREACTIVE

## 2020-09-15 LAB — TSH: TSH: 2 u[IU]/mL (ref 0.35–5.50)

## 2020-09-15 LAB — FOLLICLE STIMULATING HORMONE: FSH: 3.6 m[IU]/mL

## 2020-09-15 LAB — LDL CHOLESTEROL, DIRECT: Direct LDL: 146 mg/dL

## 2020-09-15 MED ORDER — LUBIPROSTONE 8 MCG PO CAPS: 8.0000 ug | ORAL_CAPSULE | Freq: Two times a day (BID) | ORAL | 1 refills | Status: DC

## 2020-09-15 NOTE — Assessment & Plan Note (Addendum)
She has recurrent episodes of constipation complicated by abdominal distension and weight gain. A plain abdominal film done today notes a marked amount of stool .  Will recommend a trial of amitiza 8 mcg bid.

## 2020-09-15 NOTE — Assessment & Plan Note (Signed)

## 2020-10-10 ENCOUNTER — Encounter: Payer: Self-pay | Admitting: Internal Medicine

## 2020-11-02 ENCOUNTER — Other Ambulatory Visit: Payer: Self-pay

## 2020-11-02 ENCOUNTER — Ambulatory Visit
Admission: RE | Admit: 2020-11-02 | Discharge: 2020-11-02 | Disposition: A | Discharge status: Home / Self Care | Payer: 59 | Source: Ambulatory Visit | Attending: Internal Medicine | Admitting: Internal Medicine

## 2020-11-02 DIAGNOSIS — Z1231 Encounter for screening mammogram for malignant neoplasm of breast: Secondary | ICD-10-CM | POA: Insufficient documentation

## 2020-12-28 ENCOUNTER — Telehealth: Payer: 59 | Admitting: Physician Assistant

## 2020-12-28 DIAGNOSIS — H1030 Unspecified acute conjunctivitis, unspecified eye: Secondary | ICD-10-CM | POA: Diagnosis not present

## 2020-12-28 MED ORDER — POLYMYXIN B-TRIMETHOPRIM 10000-0.1 UNIT/ML-% OP SOLN: OPHTHALMIC | 0 refills | Status: DC

## 2020-12-28 NOTE — Progress Notes (Signed)
I have spent 5 minutes in review of e-visit questionnaire, review and updating patient chart, medical decision making and response to patient.   Micael Barb Cody Jarrett [place], PA-C    

## 2020-12-28 NOTE — Progress Notes (Signed)

## 2021-01-02 ENCOUNTER — Telehealth: Payer: 59 | Admitting: Physician Assistant

## 2021-01-02 ENCOUNTER — Ambulatory Visit
Admission: EM | Admit: 2021-01-02 | Discharge: 2021-01-02 | Disposition: A | Discharge status: Home / Self Care | Payer: 59 | Attending: Emergency Medicine | Admitting: Emergency Medicine

## 2021-01-02 ENCOUNTER — Other Ambulatory Visit: Payer: Self-pay

## 2021-01-02 ENCOUNTER — Encounter: Payer: Self-pay | Admitting: Emergency Medicine

## 2021-01-02 DIAGNOSIS — H1033 Unspecified acute conjunctivitis, bilateral: Secondary | ICD-10-CM | POA: Diagnosis not present

## 2021-01-02 DIAGNOSIS — H109 Unspecified conjunctivitis: Secondary | ICD-10-CM

## 2021-01-02 DIAGNOSIS — B309 Viral conjunctivitis, unspecified: Secondary | ICD-10-CM | POA: Diagnosis not present

## 2021-01-02 MED ORDER — OFLOXACIN 0.3 % OP SOLN: 1.0000 [drp] | Freq: Four times a day (QID) | OPHTHALMIC | 0 refills | Status: DC

## 2021-01-02 NOTE — ED Provider Notes (Signed)
[facility]    CSN: [id number] Arrival date & time: 01/02/21  1059      History   Chief Complaint Chief Complaint  Patient presents with   Conjunctivitis    HPI Virginia Maldonado is a 50 y.o. female.  Patient presents with 8-day history of eye redness, itching, crusting in lashes, drainage.  She denies acute eye pain or changes in her vision at this time.  She had right eye pain during the night but this resolved.  No known injury.  No sensation of foreign body.  No fever, chills, earache, sore throat, cough, or other symptoms.  Patient had an E-visit on 12/28/2020; diagnosed with acute bacterial conjunctivitis; treated with Polytrim eyedrops.  Her symptoms have not improved on this medication.  She had another ED visit for the same thing today and was instructed to be seen in person.  The history is provided by the patient and medical records.   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      Home Medications    Prior to Admission medications   Medication Sig Start Date End Date Taking? Authorizing Provider  fexofenadine (ALLEGRA) 180 MG tablet Take 180 mg by mouth daily.  02/19/18  Yes [provider]  ofloxacin (OCUFLOX) 0.3 % ophthalmic solution Place 1 drop into both eyes 4 (four) times daily. 01/02/21  Yes Sharion Balloon, NP  lubiprostone (AMITIZA) 8 MCG capsule Take 1 capsule (8 mcg  total) by mouth 2 (two) times daily with a meal. 09/15/20   Crecencio Mc, MD  polyethylene glycol (MIRALAX) 17 g packet Take 17 g by mouth daily as needed.    [provider]    Family History History reviewed. No pertinent family history.  Social History Social History   Tobacco Use   Smoking status: Former    Types: Cigarettes    Quit date: 01/23/1996    Years since quitting: 24.9   Smokeless tobacco: Never  Vaping Use   Vaping Use: Never used  Substance Use Topics   Alcohol use: Yes   Drug use: No     Allergies   Prilosec [omeprazole] and Sertraline hcl   Review of Systems Review of Systems  Constitutional:  Negative for chills and fever.  HENT:  Negative for ear pain and sore throat.   Eyes:  Positive for discharge, redness and itching. Negative for pain and visual disturbance.  Respiratory:  Negative for cough and shortness of breath.   Cardiovascular:  Negative for chest pain and palpitations.  Skin:  Negative for color change and rash.  All other systems reviewed and are negative.   Physical Exam Triage Vital Signs ED Triage Vitals [01/02/21 1233]  Enc Vitals Group     BP 126/87     Pulse Rate 69     Resp      Temp 98.3 F (36.8 C)  Temp Source Oral     SpO2 98 %     Weight      Height      Head Circumference      Peak Flow      Pain Score      Pain Loc      Pain Edu?      Excl. in GC?    No data found.  Updated Vital Signs BP 126/87 (BP Location: Left Arm)   Pulse 69   Temp 98.3 F (36.8 C) (Oral)   LMP  (LMP Unknown)   SpO2 98%   Visual Acuity Right Eye Distance: 20/25 Left Eye Distance: 20/30 Bilateral Distance: 20/20  Right Eye Near:   Left Eye Near:    Bilateral Near:     Physical Exam Vitals and nursing note reviewed.  Constitutional:      General: She is not in acute distress.    Appearance: She is well-developed.  HENT:     Head: Normocephalic and atraumatic.     Right Ear: Tympanic membrane normal.      Left Ear: Tympanic membrane normal.     Nose: Nose normal.     Mouth/Throat:     Mouth: Mucous membranes are moist.     Pharynx: Oropharynx is clear.  Eyes:     General: Lids are normal. Vision grossly intact.     Extraocular Movements: Extraocular movements intact.     Conjunctiva/sclera:     Right eye: Right conjunctiva is injected.     Left eye: Left conjunctiva is injected.     Pupils: Pupils are equal, round, and reactive to light.  Cardiovascular:     Rate and Rhythm: Normal rate and regular rhythm.     Heart sounds: Normal heart sounds.  Pulmonary:     Effort: Pulmonary effort is normal. No respiratory distress.     Breath sounds: Normal breath sounds.  Abdominal:     Palpations: Abdomen is soft.     Tenderness: There is no abdominal tenderness.  Musculoskeletal:     Cervical back: Neck supple.  Skin:    General: Skin is warm and dry.  Neurological:     Mental Status: She is alert.  Psychiatric:        Mood and Affect: Mood normal.        Behavior: Behavior normal.     UC Treatments / Results  Labs (all labs ordered are listed, but only abnormal results are displayed) Labs Reviewed - No data to display  EKG   Radiology No results found.  Procedures Procedures (including critical care time)  Medications Ordered in UC Medications - No data to display  Initial Impression / Assessment and Plan / UC Course  I have reviewed the triage vital signs and the nursing notes.  Pertinent labs & imaging results that were available during my care of the patient were reviewed by me and considered in my medical decision making (see chart for details).  Bilateral conjunctivitis.  Patient is not improving on Polytrim eyedrops.  Treating with ofloxacin eyedrops.  Instructed patient to schedule an appointment with her eye care provider for a recheck in 1 to 2 days.  Strict ED precautions discussed.  Education provided on bacterial conjunctivitis.  Patient agrees to plan of  care.   Final Clinical Impressions(s) / UC Diagnoses   Final diagnoses:  Acute bacterial conjunctivitis of both eyes     Discharge Instructions      Use the antibiotic eyedrops as prescribed.  Follow-up with your eye doctor for a recheck in 1 to 2 days.    Go to the emergency department if you have acute eye pain, changes in your vision, or other concerning symptoms.        ED Prescriptions     Medication Sig Dispense Auth. Provider   ofloxacin (OCUFLOX) 0.3 % ophthalmic solution Place 1 drop into both eyes 4 (four) times daily. 5 mL Sharion Balloon, NP      PDMP not reviewed this encounter.   Sharion Balloon, NP 01/02/21 1309

## 2021-01-02 NOTE — ED Triage Notes (Signed)
Pt had an e-visit on 12/28/20 and prescribed eye drops but it has not helped. She has bilateral eye redness and drainage. Pt reports having Covid 2 weeks ago as well.

## 2021-01-02 NOTE — Progress Notes (Signed)
Based on what you shared with me, I feel your condition warrants further evaluation and I recommend that you be seen in a face to face visit.  Being that you have failed treatment we provide, I would recommend for you to be seen in person with your PCP, an Urgent Care or with your eye doctor if you have one.   NOTE: There will be NO CHARGE for this eVisit   If you are having a true medical emergency please call 911.      For an urgent face to face visit, [place] has six urgent care centers for your convenience:     [facility] at [facility] Directions [phone] [address] [place], [place] [address]    [facility] Center [facility]) Get Driving Directions [phone] [address] [place], [place] [address]  [facility] Urgent [facility] Center [facility] [place]) Get Driving Directions [phone] [address] [place],  [place]  [address]  [facility] at [facility] Get Driving Directions [phone] [address] [place], [place] [address]   [facility] Care at [facility] Get Driving Directions  [phone] [address] [place], [place] [address]   [facility] at [facility] Directions [phone] [address] [place], [place] [address]  Your MyChart E-visit questionnaire answers were reviewed by a board certified advanced clinical practitioner to complete your personal care plan based on your specific symptoms.  Thank you for using e-Visits.    I provided 5 minutes of non face-to-face time during this encounter for chart review and documentation.

## 2021-01-02 NOTE — Discharge Instructions (Addendum)
Use the antibiotic eyedrops as prescribed.    Follow-up with your eye doctor for a recheck in 1 to 2 days.    Go to the emergency department if you have acute eye pain, changes in your vision, or other concerning symptoms.

## 2021-07-03 ENCOUNTER — Encounter: Payer: Self-pay | Admitting: Internal Medicine

## 2021-07-03 ENCOUNTER — Ambulatory Visit: Payer: 59 | Admitting: Internal Medicine

## 2021-07-03 VITALS — BP 118/82 | HR 96 | Temp 97.7°F | Ht 66.0 in | Wt 148.2 lb

## 2021-07-03 DIAGNOSIS — R300 Dysuria: Secondary | ICD-10-CM | POA: Diagnosis not present

## 2021-07-03 LAB — POCT URINALYSIS DIPSTICK
Bilirubin, UA: NEGATIVE
Blood, UA: NEGATIVE
Glucose, UA: NEGATIVE
Ketones, UA: NEGATIVE
Leukocytes, UA: NEGATIVE
Nitrite, UA: NEGATIVE
Protein, UA: NEGATIVE
Spec Grav, UA: <=1.005 — AB (ref 1.010–1.025)
Urobilinogen, UA: 0.2 E.U./dL
pH, UA: 5.5 (ref 5.0–8.0)

## 2021-07-03 MED ORDER — ESTRADIOL 0.1 MG/GM VA CREA: 1.0000 | TOPICAL_CREAM | Freq: Every day | VAGINAL | 12 refills | Status: DC

## 2021-07-03 NOTE — Assessment & Plan Note (Signed)
UA NORMAL.  ATROPHIC VAGINITIS SUSPECTED.  TRIAL OF ESTRACE CREAM ?

## 2021-07-03 NOTE — Progress Notes (Signed)
? ?Subjective:  ?Patient ID: Virginia Maldonado, female    DOB: 07-Jul-1970  Age: 51 y.o. MRN: [id number] ? ?CC: The encounter diagnosis was Dysuria. ? ? ?HPI ?Virginia Maldonado presents for urgent evaluation of  dysuria which started on Friday ,  accompanied by right sided labial pain  aggravated by sitting on the toilet.  Symptoms have improved with otc analgesics  no nausea not seuxally active  no recent bike rides of pool swimming  ?Chief Complaint  ?Patient presents with  ? Urinary Tract Infection  ? ? ? ? ?Outpatient Medications Prior to Visit  ?Medication Sig Dispense Refill  ? fexofenadine (ALLEGRA) 180 MG tablet Take 180 mg by mouth daily.     ? lubiprostone (AMITIZA) 8 MCG capsule Take 1 capsule (8 mcg total) by mouth 2 (two) times daily with a meal. (Patient not taking: Reported on 07/03/2021) 60 capsule 1  ? ofloxacin (OCUFLOX) 0.3 % ophthalmic solution Place 1 drop into both eyes 4 (four) times daily. (Patient not taking: Reported on 07/03/2021) 5 mL 0  ? polyethylene glycol (MIRALAX / GLYCOLAX) 17 g packet Take 17 g by mouth daily as needed. (Patient not taking: Reported on 07/03/2021)    ? ?No facility-administered medications prior to visit.  ? ? ?Review of Systems; ? ?Patient denies headache, fevers, malaise, unintentional weight loss, skin rash, eye pain, sinus congestion and sinus pain, sore throat, dysphagia,  hemoptysis , cough, dyspnea, wheezing, chest pain, palpitations, orthopnea, edema, abdominal pain, nausea, melena, diarrhea, constipation, flank pain, dysuria, hematuria, urinary  Frequency, nocturia, numbness, tingling, seizures,  Focal weakness, Loss of consciousness,  Tremor, insomnia, depression, anxiety, and suicidal ideation.   ? ? ? ?Objective:  ?BP 118/82 (BP Location: Left Arm, Patient Position: Sitting, Cuff Size: Normal)   Pulse 96   Temp 97.7 ?F (36.5 ?C) (Oral)   Ht 5\' 6"  (1.676 m)   Wt 148 lb 3.2 oz (67.2 kg)   LMP  (LMP Unknown)   SpO2 99%   BMI 23.92 kg/m?  ? ?BP Readings from Last 3  Encounters:  ?07/03/21 118/82  ?01/02/21 126/87  ?09/14/20 100/68  ? ? ?Wt Readings from Last 3 Encounters:  ?07/03/21 148 lb 3.2 oz (67.2 kg)  ?09/14/20 149 lb 6.4 oz (67.8 kg)  ?06/28/20 150 lb (68 kg)  ? ?General Appearance:    Alert, cooperative, no distress, appears stated age  ?Head:    Normocephalic, without obvious abnormality, atraumatic  ?Eyes:    PERRL, conjunctiva/corneas clear, EOM's intact, fundi  ?  benign, both eyes  ?   ?   ?   ?   ?   ?   ?   ?   ?   ?   Soft, non-tender, bowel sounds active all four quadrants,  ?  no masses, no organomegaly  ?Genitalia:    Pelvic: cervix normal in appearance, external genitalia normal, no adnexal masses or tenderness, no cervical motion tenderness, rectovaginal septum normal, uterus normal size, shape, and consistency and vagina normal without discharge  ?Extremities:   Extremities normal, atraumatic, no cyanosis or edema  ?Pulses:   2+ and symmetric all extremities  ?Skin:   Skin color, texture, turgor normal, no rashes or lesions  ?Lymph nodes:   Cervical, supraclavicular, and axillary nodes normal  ?Neurologic:   CNII-XII intact, normal strength, sensation and reflexes  ?  throughout  ? ? ?No results found for: HGBA1C ? ?Lab Results  ?Component Value Date  ? CREATININE 0.91 09/14/2020  ? CREATININE  0.84 08/05/2019  ? CREATININE 0.89 05/24/2017  ? ? ?Lab Results  ?Component Value Date  ? WBC 7.6 08/05/2019  ? HGB 12.4 08/05/2019  ? HCT 36.4 08/05/2019  ? PLT 258.0 08/05/2019  ? GLUCOSE 85 09/14/2020  ? CHOL 237 (H) 09/14/2020  ? TRIG 253.0 (H) 09/14/2020  ? HDL 61.30 09/14/2020  ? LDLDIRECT 146.0 09/14/2020  ? LDLCALC 133 (H) 05/24/2017  ? ALT 8 09/14/2020  ? AST 14 09/14/2020  ? NA 137 09/14/2020  ? K 3.7 09/14/2020  ? CL 100 09/14/2020  ? CREATININE 0.91 09/14/2020  ? BUN 13 09/14/2020  ? CO2 27 09/14/2020  ? TSH 2.00 09/14/2020  ? ? ?No results found. ? ?Assessment & Plan:  ? ?Problem List Items Addressed This Visit   ? ? Dysuria - Primary  ?  UA NORMAL.   ATROPHIC VAGINITIS SUSPECTED.  TRIAL OF ESTRACE CREAM ? ?  ?  ? Relevant Orders  ? POCT Urinalysis Dipstick (Completed)  ? Urine Culture  ? Urine Microalbumin w/creat. ratio  ? ?  ? ?Follow-up: No follow-ups on file. ? ? ?Sherlene Shams, MD ?

## 2021-07-03 NOTE — Patient Instructions (Signed)
Everything looks normal from the waist down. ? ?I will update you as the culture is available ? ?Try the estrogen cream (if affordable):  NIGHTLY for 2 weeks,  (just dab on the areas that feel irritated), then twice WEEKLY thereafter  ? ? ?

## 2021-09-15 ENCOUNTER — Encounter: Payer: 59 | Admitting: Internal Medicine

## 2021-11-01 ENCOUNTER — Encounter: Payer: 59 | Admitting: Internal Medicine

## 2022-03-14 ENCOUNTER — Encounter: Payer: Self-pay | Admitting: Internal Medicine

## 2022-03-14 ENCOUNTER — Ambulatory Visit: Payer: 59 | Admitting: Internal Medicine

## 2022-03-14 VITALS — BP 120/68 | HR 61 | Temp 98.0°F | Resp 14 | Ht 66.0 in | Wt 137.0 lb

## 2022-03-14 DIAGNOSIS — J0110 Acute frontal sinusitis, unspecified: Secondary | ICD-10-CM | POA: Diagnosis not present

## 2022-03-14 DIAGNOSIS — R519 Headache, unspecified: Secondary | ICD-10-CM | POA: Insufficient documentation

## 2022-03-14 DIAGNOSIS — R5383 Other fatigue: Secondary | ICD-10-CM | POA: Diagnosis not present

## 2022-03-14 DIAGNOSIS — E782 Mixed hyperlipidemia: Secondary | ICD-10-CM

## 2022-03-14 DIAGNOSIS — R7301 Impaired fasting glucose: Secondary | ICD-10-CM | POA: Diagnosis not present

## 2022-03-14 DIAGNOSIS — J301 Allergic rhinitis due to pollen: Secondary | ICD-10-CM | POA: Diagnosis not present

## 2022-03-14 DIAGNOSIS — G4452 New daily persistent headache (NDPH): Secondary | ICD-10-CM

## 2022-03-14 DIAGNOSIS — I83813 Varicose veins of bilateral lower extremities with pain: Secondary | ICD-10-CM | POA: Diagnosis not present

## 2022-03-14 LAB — CBC WITH DIFFERENTIAL/PLATELET
Basophils Absolute: 0.1 10*3/uL (ref 0.0–0.1)
Basophils Relative: 1.5 % (ref 0.0–3.0)
Eosinophils Absolute: 0.3 10*3/uL (ref 0.0–0.7)
Eosinophils Relative: 3.2 % (ref 0.0–5.0)
HCT: 37 % (ref 36.0–46.0)
Hemoglobin: 12.2 g/dL (ref 12.0–15.0)
Lymphocytes Relative: 22 % (ref 12.0–46.0)
Lymphs Abs: 1.9 10*3/uL (ref 0.7–4.0)
MCHC: 33.1 g/dL (ref 30.0–36.0)
MCV: 92.5 fl (ref 78.0–100.0)
Monocytes Absolute: 0.6 10*3/uL (ref 0.1–1.0)
Monocytes Relative: 7 % (ref 3.0–12.0)
Neutro Abs: 5.7 10*3/uL (ref 1.4–7.7)
Neutrophils Relative %: 66.3 % (ref 43.0–77.0)
Platelets: 306 10*3/uL (ref 150.0–400.0)
RBC: 4 Mil/uL (ref 3.87–5.11)
RDW: 13.9 % (ref 11.5–15.5)
WBC: 8.5 10*3/uL (ref 4.0–10.5)

## 2022-03-14 LAB — COMPREHENSIVE METABOLIC PANEL
ALT: 17 U/L (ref 0–35)
AST: 17 U/L (ref 0–37)
Albumin: 4.6 g/dL (ref 3.5–5.2)
Alkaline Phosphatase: 50 U/L (ref 39–117)
BUN: 11 mg/dL (ref 6–23)
CO2: 29 mEq/L (ref 19–32)
Calcium: 9.7 mg/dL (ref 8.4–10.5)
Chloride: 103 mEq/L (ref 96–112)
Creatinine, Ser: 0.73 mg/dL (ref 0.40–1.20)
GFR: 95.15 mL/min (ref >60.00)
Glucose, Bld: 89 mg/dL (ref 70–99)
Potassium: 4 mEq/L (ref 3.5–5.1)
Sodium: 140 mEq/L (ref 135–145)
Total Bilirubin: 0.5 mg/dL (ref 0.2–1.2)
Total Protein: 6.9 g/dL (ref 6.0–8.3)

## 2022-03-14 LAB — LIPID PANEL
Cholesterol: 255 mg/dL — ABNORMAL HIGH (ref 0–200)
HDL: 50.4 mg/dL (ref >39.00)
LDL Cholesterol: 180 mg/dL — ABNORMAL HIGH (ref 0–99)
NonHDL: 204.58
Total CHOL/HDL Ratio: 5
Triglycerides: 122 mg/dL (ref 0.0–149.0)
VLDL: 24.4 mg/dL (ref 0.0–40.0)

## 2022-03-14 LAB — LDL CHOLESTEROL, DIRECT: Direct LDL: 175 mg/dL

## 2022-03-14 LAB — HEMOGLOBIN A1C: Hgb A1c MFr Bld: 5.6 % (ref 4.6–6.5)

## 2022-03-14 LAB — TSH: TSH: 2.22 u[IU]/mL (ref 0.35–5.50)

## 2022-03-14 MED ORDER — PREDNISONE 10 MG PO TABS: ORAL_TABLET | ORAL | 0 refills | Status: DC

## 2022-03-14 MED ORDER — FLUCONAZOLE 150 MG PO TABS: 150.0000 mg | ORAL_TABLET | Freq: Every day | ORAL | 0 refills | Status: DC

## 2022-03-14 MED ORDER — MOMETASONE FUROATE 50 MCG/ACT NA SUSP: 2.0000 | Freq: Every day | NASAL | 12 refills | Status: DC

## 2022-03-14 MED ORDER — AMOXICILLIN-POT CLAVULANATE 875-125 MG PO TABS: 1.0000 | ORAL_TABLET | Freq: Two times a day (BID) | ORAL | 0 refills | Status: DC

## 2022-03-14 NOTE — Assessment & Plan Note (Signed)
   Sinus drainage and congestion wth fontal headache s,  left ear /decreased hearing for one week following expposure to son with viral infection, not improving  advil and tylenol .  Prednisone taper,  nasonex, augmentin  , afrin

## 2022-03-14 NOTE — Patient Instructions (Addendum)
Prednisone taper ,  nasonex and afrin for the week (afrin only 5 days max) add abx  if  nasal drainage become purulent  or headache does not improve  Wear the compression tights daily.  Vascular referral in process    Return in June or CPE with PAP

## 2022-03-14 NOTE — Assessment & Plan Note (Signed)
Adding nasonex

## 2022-03-14 NOTE — Assessment & Plan Note (Signed)
With prior ablation /excision  of right popliteal vein by DR Jamal Collin following a pregnancy induced DVT in 2008.  Resume daily use of compression tights and refer to [facility] for definitive therapy

## 2022-03-14 NOTE — Progress Notes (Unsigned)
Subjective:  Patient ID: Virginia Maldonado, female    DOB: 1970-04-20  Age: 52 y.o. MRN: [id number]  CC: The primary encounter diagnosis was Moderate mixed hyperlipidemia not requiring statin therapy. Diagnoses of Other fatigue, Impaired fasting glucose, Varicose veins of both lower extremities with pain, Seasonal allergic rhinitis due to pollen, and Acute non-recurrent frontal sinusitis were also pertinent to this visit.   HPI Virginia Maldonado presents for  Chief Complaint  Patient presents with   Acute Visit   Headache   Varicose Veins   Varicose veins  right leg.  Chronic since 5th grade.   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 Reesa Chew in 2009,  but has come back .  Popliteal area looks constantly bruised due to amount of varicosity  painful at night,  throbs.  Uses compression panty and compression tights but not during the winter   referral needed   Headache s, new onset fontal /crown constant ache on and off for one week. Some sinus drainage has increased.  Feels heaviness in the left ear and decreased hearing.  Some TMJ of right jaw. Wears a mouth guard du to bruxism     Outpatient Medications Prior to Visit  Medication Sig Dispense Refill   fexofenadine (ALLEGRA) 180 MG tablet Take 180 mg by mouth daily.      polyethylene glycol (MIRALAX / GLYCOLAX) 17 g packet Take 17 g by mouth daily as needed.     estradiol (ESTRACE VAGINAL) 0.1 MG/GM vaginal cream Place 1 Applicatorful vaginally at bedtime. 42.5 g 12   lubiprostone (AMITIZA) 8 MCG capsule Take 1 capsule (8 mcg total) by mouth 2 (two) times daily with a meal. (Patient not taking: Reported on 07/03/2021) 60 capsule 1   ofloxacin (OCUFLOX) 0.3 % ophthalmic solution Place 1 drop into both eyes 4 (four) times daily. (Patient not taking: Reported on 07/03/2021) 5 mL 0   No facility-administered medications prior to visit.    Review of Systems;  Patient denies headache, fevers, malaise, unintentional weight loss,  skin rash, eye pain, sinus congestion and sinus pain, sore throat, dysphagia,  hemoptysis , cough, dyspnea, wheezing, chest pain, palpitations, orthopnea, edema, abdominal pain, nausea, melena, diarrhea, constipation, flank pain, dysuria, hematuria, urinary  Frequency, nocturia, numbness, tingling, seizures,  Focal weakness, Loss of consciousness,  Tremor, insomnia, depression, anxiety, and suicidal ideation.      Objective:  BP 120/68   Pulse 61   Temp 98 F (36.7 C) (Temporal)   Resp 14   Ht 5\' 6"  (1.676 m)   Wt 137 lb (62.1 kg)   SpO2 99%   BMI 22.11 kg/m   BP Readings from Last 3 Encounters:  03/14/22 120/68  07/03/21 118/82  01/02/21 126/87    Wt Readings from Last 3 Encounters:  03/14/22 137 lb (62.1 kg)  07/03/21 148 lb 3.2 oz (67.2 kg)  09/14/20 149 lb 6.4 oz (67.8 kg)    Physical Exam Vitals reviewed.  Constitutional:      General: She is not in acute distress.    Appearance: Normal appearance. She is normal weight. She is not ill-appearing, toxic-appearing or diaphoretic.  HENT:     Head: Normocephalic.  Eyes:     General: No scleral icterus.       Right eye: No discharge.        Left eye: No discharge.     Conjunctiva/sclera: Conjunctivae normal.  Musculoskeletal:        General: Tenderness present.  No swelling. Normal range of motion.  Skin:    General: Skin is warm and dry.          Comments: Varicositeis and spider veins   Neurological:     General: No focal deficit present.     Mental Status: She is alert and oriented to person, place, and time. Mental status is at baseline.  Psychiatric:        Mood and Affect: Mood normal.        Behavior: Behavior normal.        Thought Content: Thought content normal.        Judgment: Judgment normal.    No results found for: "HGBA1C"  Lab Results  Component Value Date   CREATININE 0.91 09/14/2020   CREATININE 0.84 08/05/2019   CREATININE 0.89 05/24/2017    Lab Results  Component Value Date   WBC  7.6 08/05/2019   HGB 12.4 08/05/2019   HCT 36.4 08/05/2019   PLT 258.0 08/05/2019   GLUCOSE 85 09/14/2020   CHOL 237 (H) 09/14/2020   TRIG 253.0 (H) 09/14/2020   HDL 61.30 09/14/2020   LDLDIRECT 146.0 09/14/2020   LDLCALC 133 (H) 05/24/2017   ALT 8 09/14/2020   AST 14 09/14/2020   NA 137 09/14/2020   K 3.7 09/14/2020   CL 100 09/14/2020   CREATININE 0.91 09/14/2020   BUN 13 09/14/2020   CO2 27 09/14/2020   TSH 2.00 09/14/2020    No results found.  Assessment & Plan:  .Moderate mixed hyperlipidemia not requiring statin therapy -     LDL cholesterol, direct -     Lipid panel -     Comprehensive metabolic panel -     Hemoglobin A1c  Other fatigue -     TSH -     CBC with Differential/Platelet  Impaired fasting glucose -     Comprehensive metabolic panel -     Hemoglobin A1c  Varicose veins of both lower extremities with pain Assessment & Plan: With prior ablation /excision  of right popliteal vein by DR Jamal Collin following a pregnancy induced DVT in 2008.  Resume daily use of compression tights and refer to [facility] for definitive therapy   Orders: -     For home use only DME Other see comment -     Ambulatory referral to Vascular Surgery  Seasonal allergic rhinitis due to pollen Assessment & Plan: Adding nasonex   Acute non-recurrent frontal sinusitis Assessment & Plan:   Sinus drainage and congestion wth fontal headache s,  left ear /decreased hearing for one week following expposure to son with viral infection, not improving  advil and tylenol .  Prednisone taper,  nasonex, augmentin  , afrin    Other orders -     Mometasone Furoate; Place 2 sprays into the nose daily.  Dispense: 1 each; Refill: 12 -     predniSONE; 6 tablets on Day 1 , then reduce by 1 tablet daily until gone  Dispense: 21 tablet; Refill: 0 -     Amoxicillin-Pot Clavulanate; Take 1 tablet by mouth 2 (two) times daily.  Dispense: 14 tablet; Refill: 0 -     Fluconazole; Take 1 tablet (150 mg  total) by mouth daily.  Dispense: 2 tablet; Refill: 0    Follow-up: Return in about 4 months (around 07/23/2022) for physical.   Crecencio Mc, MD

## 2022-03-15 NOTE — Assessment & Plan Note (Signed)
Current symptoms appear to be due to sinusitis.  Weill reassess after treatment

## 2022-04-24 ENCOUNTER — Telehealth: Payer: Self-pay

## 2022-04-24 NOTE — Telephone Encounter (Signed)
Called and spoke with pt to go over meds and ask about symptoms and confirm pharm

## 2022-04-24 NOTE — Progress Notes (Unsigned)
Virginia Morrow, NP-C Phone: [phone]  Virginia Maldonado is a 52 y.o. female who presents today for left foot injury.   Patient reports injuring her foot while walking in November. She reports walking on rocks at the time and feeling like she stepped wrong. She reports her foot has been swelling off and on since that time. She reports pain along the top of her foot and on the ball of her foot. She has decreased range of motion in her toes. She had a boot at home that she wore for 6 weeks without relief in symptoms. She has also tired wearing a post-op shoe, putting extra cushioning in her shoes at the ball of her foot, doing home exercises, ice, Tylenol and Ibuprofen all without relief.   Social History   Tobacco Use  Smoking Status Former   Types: Cigarettes   Quit date: 01/23/1996   Years since quitting: 26.2  Smokeless Tobacco Never    Current Outpatient Medications on File Prior to Visit  Medication Sig Dispense Refill   fexofenadine (ALLEGRA) 180 MG tablet Take 180 mg by mouth daily.      polyethylene glycol (MIRALAX / GLYCOLAX) 17 g packet Take 17 g by mouth daily as needed.     amoxicillin-clavulanate (AUGMENTIN) 875-125 MG tablet Take 1 tablet by mouth 2 (two) times daily. (Patient not taking: Reported on 04/25/2022) 14 tablet 0   fluconazole (DIFLUCAN) 150 MG tablet Take 1 tablet (150 mg total) by mouth daily. (Patient not taking: Reported on 04/25/2022) 2 tablet 0   mometasone (NASONEX) 50 MCG/ACT nasal spray Place 2 sprays into the nose daily. (Patient not taking: Reported on 04/25/2022) 1 each 12   predniSONE (DELTASONE) 10 MG tablet 6 tablets on Day 1 , then reduce by 1 tablet daily until gone (Patient not taking: Reported on 04/25/2022) 21 tablet 0   No current facility-administered medications on file prior to visit.     ROS see history of present illness  Objective  Physical Exam Vitals:   04/25/22 0833  BP: 100/66  Pulse: (!) 54  Temp: 98.6 F (37 C)  SpO2: 99%    BP  Readings from Last 3 Encounters:  04/25/22 100/66  03/14/22 120/68  07/03/21 118/82   Wt Readings from Last 3 Encounters:  04/25/22 134 lb 3.2 oz (60.9 kg)  03/14/22 137 lb (62.1 kg)  07/03/21 148 lb 3.2 oz (67.2 kg)    Physical Exam Constitutional:      General: She is not in acute distress.    Appearance: Normal appearance.  HENT:     Head: Normocephalic.  Cardiovascular:     Rate and Rhythm: Normal rate and regular rhythm.     Heart sounds: Normal heart sounds.  Pulmonary:     Effort: Pulmonary effort is normal.     Breath sounds: Normal breath sounds.  Musculoskeletal:     Right foot: Normal.     Left foot: Decreased range of motion (toes). Swelling (noted in toes and across top of foot) and tenderness (deep palpation in ball of foot) present.  Skin:    General: Skin is warm and dry.  Neurological:     General: No focal deficit present.     Mental Status: She is alert.  Psychiatric:        Mood and Affect: Mood normal.        Behavior: Behavior normal.    Assessment/Plan: Please see individual problem list.  Left foot pain Assessment & Plan: Will get x-ray  of left foot and refer to Podiatry for further evaluation. Will contact patient with results of x-ray. Encouraged to continue ice, elevation, Tylenol/Ibuprofen and wearing supportive shoes.   Orders: -     DG Foot Complete Left; Future -     Ambulatory referral to Podiatry   Return if symptoms worsen or fail to improve.   Virginia Morrow, NP-C [place]

## 2022-04-25 ENCOUNTER — Ambulatory Visit: Payer: 59 | Admitting: Nurse Practitioner

## 2022-04-25 ENCOUNTER — Ambulatory Visit
Admission: RE | Admit: 2022-04-25 | Discharge: 2022-04-25 | Disposition: A | Discharge status: Home / Self Care | Payer: 59 | Source: Ambulatory Visit | Attending: Nurse Practitioner | Admitting: Nurse Practitioner

## 2022-04-25 ENCOUNTER — Encounter: Payer: Self-pay | Admitting: Nurse Practitioner

## 2022-04-25 VITALS — BP 100/66 | HR 54 | Temp 98.6°F | Ht 66.0 in | Wt 134.2 lb

## 2022-04-25 DIAGNOSIS — M79672 Pain in left foot: Secondary | ICD-10-CM

## 2022-04-25 DIAGNOSIS — S99922A Unspecified injury of left foot, initial encounter: Secondary | ICD-10-CM | POA: Diagnosis not present

## 2022-04-25 NOTE — Assessment & Plan Note (Signed)
Will get x-ray of left foot and refer to Podiatry for further evaluation. Will contact patient with results of x-ray. Encouraged to continue ice, elevation, Tylenol/Ibuprofen and wearing supportive shoes.

## 2022-05-03 ENCOUNTER — Encounter: Payer: Self-pay | Admitting: Podiatry

## 2022-05-03 ENCOUNTER — Ambulatory Visit: Payer: 59 | Admitting: Podiatry

## 2022-05-03 VITALS — BP 95/62 | HR 65

## 2022-05-03 DIAGNOSIS — M7752 Other enthesopathy of left foot: Secondary | ICD-10-CM | POA: Diagnosis not present

## 2022-05-03 DIAGNOSIS — M9272 Juvenile osteochondrosis of metatarsus, left foot: Secondary | ICD-10-CM

## 2022-05-03 DIAGNOSIS — Q667 Congenital pes cavus, unspecified foot: Secondary | ICD-10-CM

## 2022-05-03 DIAGNOSIS — M7751 Other enthesopathy of right foot: Secondary | ICD-10-CM

## 2022-05-03 NOTE — Progress Notes (Signed)
Subjective:  Patient ID: Virginia Maldonado, female    DOB: January 02, 1971,  MRN: [id number]  Chief Complaint  Patient presents with   Foot Pain    "I have pain on top of my foot and the base of my toes." N - foot pain and toes L - dorsal pain and 2,3 toes left D - November 2023 O - suddenly, about the same, constant C - throbbing, aching and sore, sharp pain w/ sudden movement A - sudden movement, walking, standing T - boot, ice, elevate, taped 2 toes, Saw Dr. Derrel Nip - xrays    52 y.o. female presents with the above complaint.  Patient presents with left second metatarsophalangeal joint pain to touch.  Patient states that it came on suddenly has been on since November 2023.  She states that it hurts with certain movement walking standing.  She has tried boot ice elevate tape the 2 toes nothing has helped.  She denies any other acute complaint would like to discuss treatment options she likes to walk a lot on her foot.   Review of Systems: Negative except as noted in the HPI. Denies N/V/F/Ch.  Past Medical History:  Diagnosis Date   COPD (chronic obstructive pulmonary disease) ([place])     Current Outpatient Medications:    fexofenadine (ALLEGRA) 180 MG tablet, Take 180 mg by mouth daily. , Disp: , Rfl:    polyethylene glycol (MIRALAX / GLYCOLAX) 17 g packet, Take 17 g by mouth daily as needed., Disp: , Rfl:    amoxicillin-clavulanate (AUGMENTIN) 875-125 MG tablet, Take 1 tablet by mouth 2 (two) times daily. (Patient not taking: Reported on 04/25/2022), Disp: 14 tablet, Rfl: 0   fluconazole (DIFLUCAN) 150 MG tablet, Take 1 tablet (150 mg total) by mouth daily. (Patient not taking: Reported on 04/25/2022), Disp: 2 tablet, Rfl: 0   mometasone (NASONEX) 50 MCG/ACT nasal spray, Place 2 sprays into the nose daily. (Patient not taking: Reported on 04/25/2022), Disp: 1 each, Rfl: 12   predniSONE (DELTASONE) 10 MG tablet, 6 tablets on Day 1 , then reduce by 1 tablet daily until gone (Patient not taking: Reported  on 04/25/2022), Disp: 21 tablet, Rfl: 0  Social History   Tobacco Use  Smoking Status Former   Types: Cigarettes   Quit date: 01/23/1996   Years since quitting: 26.2  Smokeless Tobacco Never    Allergies  Allergen Reactions   Prilosec [Omeprazole] Hives   Sertraline Hcl Rash   Objective:   Vitals:   05/03/22 1403  BP: 95/62  Pulse: 65   There is no height or weight on file to calculate BMI. Constitutional Well developed. Well nourished.  Vascular Dorsalis pedis pulses palpable bilaterally. Posterior tibial pulses palpable bilaterally. Capillary refill normal to all digits.  No cyanosis or clubbing noted. Pedal hair growth normal.  Neurologic Normal speech. Oriented to person, place, and time. Epicritic sensation to light touch grossly present bilaterally.  Dermatologic Nails well groomed and normal in appearance. No open wounds. No skin lesions.  Orthopedic: Pain on palpation left second metatarsophalangeal joint pain with range of motion of the joint deep intra-articular pain noted.  No pain at the rest of the MPJs.  Negative Mulder's click noted.   Radiographs: 3 views of skeletally mature adult left foot: Possible fibrous disease noted with flattening of the second metatarsal head.  No fractures noted.  No other bony abnormalities identified Assessment:   1. Pes cavus   2. Freiberg's disease, left   3. Capsulitis of metatarsophalangeal (MTP)  joint of left foot    Plan:  Patient was evaluated and treated and all questions answered.  Left second metatarsophalangeal joint 3 [place]ib[place] disease/capsulitis -All questions and concerns were discussed with the patient in extensive detail.  Given that she has done cam boot immobilization for 6 to 8 weeks and has failed it I believe she will benefit from steroid injection to help decrease acute inflammatory component associate with pain.  Patient agrees with plan like to proceed with steroid injection -A steroid injection was  performed at left second metatarsophalangeal joint using 1% plain Lidocaine and 10 mg of Kenalog. This was well tolerated. -I did go sugar modification in extensive detail  Pes cavus -I explained to patient the etiology of pes planovalgus and relationship with Freiberg's disease disease and various treatment options were discussed.  Given patient foot structure in the setting of Freiberg disease I believe patient will benefit from custom-made orthotics to help control the hindfoot motion support the arch of the foot and take the stress away from second metatarsophalangeal joint patient agrees with the plan like to proceed with orthotics -Patient was casted for orthotics with offloading of bilateral second metatarsophalangeal joint    No follow-ups on file.

## 2022-05-11 ENCOUNTER — Ambulatory Visit: Payer: 59 | Admitting: Podiatry

## 2022-05-29 ENCOUNTER — Other Ambulatory Visit (INDEPENDENT_AMBULATORY_CARE_PROVIDER_SITE_OTHER): Payer: Self-pay | Admitting: Nurse Practitioner

## 2022-05-29 DIAGNOSIS — I83813 Varicose veins of bilateral lower extremities with pain: Secondary | ICD-10-CM

## 2022-06-05 ENCOUNTER — Telehealth: Payer: Self-pay | Admitting: Podiatry

## 2022-06-05 NOTE — Telephone Encounter (Signed)
Lmom to call back to schedule orthotics , currently in [place], will ship to [place] when scheduled\     Balance 490.00

## 2022-06-06 ENCOUNTER — Encounter (INDEPENDENT_AMBULATORY_CARE_PROVIDER_SITE_OTHER): Payer: 59 | Admitting: Nurse Practitioner

## 2022-06-06 ENCOUNTER — Encounter (INDEPENDENT_AMBULATORY_CARE_PROVIDER_SITE_OTHER): Payer: 59

## 2022-08-02 ENCOUNTER — Encounter: Payer: 59 | Admitting: Internal Medicine

## 2022-08-20 ENCOUNTER — Encounter: Payer: 59 | Admitting: Internal Medicine

## 2022-08-31 ENCOUNTER — Telehealth: Payer: 59 | Admitting: Physician Assistant

## 2022-08-31 DIAGNOSIS — L255 Unspecified contact dermatitis due to plants, except food: Secondary | ICD-10-CM | POA: Diagnosis not present

## 2022-08-31 MED ORDER — PREDNISONE 10 MG (21) PO TBPK: ORAL_TABLET | ORAL | 0 refills | Status: DC

## 2022-08-31 NOTE — Progress Notes (Signed)

## 2022-09-07 ENCOUNTER — Ambulatory Visit (INDEPENDENT_AMBULATORY_CARE_PROVIDER_SITE_OTHER): Payer: 59 | Admitting: Podiatry

## 2022-09-07 DIAGNOSIS — M7752 Other enthesopathy of left foot: Secondary | ICD-10-CM

## 2022-09-07 DIAGNOSIS — Q667 Congenital pes cavus, unspecified foot: Secondary | ICD-10-CM

## 2022-09-07 NOTE — Progress Notes (Signed)
Patient presents today to pick up custom orthotics   Patient was dispensed 1 pair of custom orthotics  Fit was satisfactory. Instructions for break-in and wear was reviewed and a copy was given to the patient.    

## 2022-09-19 ENCOUNTER — Encounter (INDEPENDENT_AMBULATORY_CARE_PROVIDER_SITE_OTHER): Payer: Self-pay

## 2022-10-02 ENCOUNTER — Encounter (INDEPENDENT_AMBULATORY_CARE_PROVIDER_SITE_OTHER): Payer: Self-pay | Admitting: Vascular Surgery

## 2022-10-02 ENCOUNTER — Encounter (INDEPENDENT_AMBULATORY_CARE_PROVIDER_SITE_OTHER): Payer: 59

## 2022-10-02 ENCOUNTER — Ambulatory Visit (INDEPENDENT_AMBULATORY_CARE_PROVIDER_SITE_OTHER): Payer: 59

## 2022-10-02 ENCOUNTER — Encounter (INDEPENDENT_AMBULATORY_CARE_PROVIDER_SITE_OTHER): Payer: 59 | Admitting: Nurse Practitioner

## 2022-10-02 ENCOUNTER — Ambulatory Visit (INDEPENDENT_AMBULATORY_CARE_PROVIDER_SITE_OTHER): Payer: 59 | Admitting: Vascular Surgery

## 2022-10-02 VITALS — BP 108/72 | HR 53 | Resp 16 | Wt 132.4 lb

## 2022-10-02 DIAGNOSIS — I83813 Varicose veins of bilateral lower extremities with pain: Secondary | ICD-10-CM

## 2022-10-02 NOTE — Assessment & Plan Note (Signed)
Reflux study today was performed.  The great saphenous vein remains ablated/stripped after her previous interventions.  She does have prominent varicosities present in the distribution as described above.  She also has deep venous reflux present.  She has diligently worn compression socks and elevated her legs and has had venous treatments on multiple occasions over many years.  She takes anti-inflammatories for discomfort.  Despite appropriate conservative therapy for years, she continues to have significant symptoms of painful varicosities on the right leg.  As such, foam sclerotherapy would be appropriate for treatment of these large symptomatic varicosities on the right leg.  I have discussed the risks and benefits and she desires to proceed.

## 2022-10-02 NOTE — Progress Notes (Signed)
Patient ID: Virginia Maldonado, female   DOB: 1970-11-13, 52 y.o.   MRN: [id number]  Chief Complaint  Patient presents with   New Patient (Initial Visit)    Ref Darrick Huntsman consult varicose veins, symptomatic despite use of compression    HPI Virginia Maldonado is a 52 y.o. female.  I am asked to see the patient by Dr. Darrick Huntsman for evaluation of venous insufficiency.  She has a long history of venous disease.  With her pregnancy many years ago, she had what sounds like a significant right lower extremity DVT.  She has been bothered by venous insufficiency on that leg for many years.  She has previously undergone what sounds like a vein stripping, laser ablation, and sclerotherapy treatments on the right.  She says for many years this did quite well.  However, over the past couple of years she has noticed more pain and more enlargement of the prominent varicosities particular in her right thigh and posterior calf area.  She remains active and exercises frequently with a good weight.  She wears compression socks and elevates her legs.  She does need anti-inflammatories for discomfort.    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      polyethylene  glycol (MIRALAX / GLYCOLAX) 17 g packet Take 17 g by mouth daily as needed.     predniSONE (STERAPRED UNI-PAK 21 TAB) 10 MG (21) TBPK tablet 6 day taper; take as directed on package instructions 21 tablet 0   No current facility-administered medications for this visit.      REVIEW OF SYSTEMS (Negative unless checked)  Constitutional: [] Weight loss  [] Fever  [] Chills Cardiac: [] Chest pain   [] Chest pressure   [] Palpitations   [] Shortness of breath when laying flat   [] Shortness of breath at rest   [] Shortness of breath with exertion. Vascular:  [x] Pain in legs with walking   [x] Pain in legs at rest   [] Pain in legs when laying flat   [] Claudication   [] Pain in feet when walking  [] Pain in feet at rest  [] Pain in feet when laying flat   [] History of DVT   [] Phlebitis   [] Swelling in legs   [x] Varicose veins   [] Non-healing ulcers Pulmonary:   [] Uses home oxygen   [] Productive cough   [] Hemoptysis   [] Wheeze  [x] COPD   [] Asthma Neurologic:  [] Dizziness  [] Blackouts   [] Seizures   [] History of stroke   [] History of TIA  [] Aphasia   [] Temporary blindness   [] Dysphagia   [] Weakness or numbness in arms   [] Weakness or numbness in legs Musculoskeletal:  []   Arthritis   [] Joint swelling   [] Joint pain   [] Low back pain Hematologic:  [] Easy bruising  [] Easy bleeding   [] Hypercoagulable state   [] Anemic  [] Hepatitis Gastrointestinal:  [] Blood in stool   [] Vomiting blood  [] Gastroesophageal reflux/heartburn   [] Abdominal pain Genitourinary:  [] Chronic kidney disease   [] Difficult urination  [] Frequent urination  [] Burning with urination   [] Hematuria Skin:  [] Rashes   [] Ulcers   [] Wounds Psychological:  [] History of anxiety   []  History of major depression.    Physical Exam BP 108/72 (BP Location: Left Arm)   Pulse (!) 53   Resp 16   Wt 132 lb 6.4 oz (60.1 kg)   BMI 21.37 kg/m  Gen:  WD/WN, NAD. Appears younger than stated age. Head: [place]/AT, No temporalis wasting.  Ear/Nose/Throat: Hearing grossly  intact, nares w/o erythema or drainage, oropharynx w/o Erythema/Exudate Eyes: Conjunctiva clear, sclera non-icteric  Neck: trachea midline.  No JVD.  Pulmonary:  Good air movement, respirations not labored, no use of accessory muscles  Cardiac: RRR, no JVD Vascular: Prominent varicosities are noted mostly in the medial and lateral thigh area likely in the branches of the previous saphenous vein distribution in the anterior accessory saphenous vein. Vessel Right Left  Radial Palpable Palpable                                   Gastrointestinal:. No masses, surgical incisions, or scars. Musculoskeletal: M/S 5/5 throughout.  Extremities without ischemic changes.  No deformity or atrophy.  No appreciable edema. Neurologic: Sensation grossly intact in extremities.  Symmetrical.  Speech is fluent. Motor exam as listed above. Psychiatric: Judgment intact, Mood & affect appropriate for pt's clinical situation. Dermatologic: No rashes or ulcers noted.  No cellulitis or open wounds.    Radiology No results found.  Labs No results found for this or any previous visit (from the past 2160 hour(s)).  Assessment/Plan:  Varicose veins of both lower extremities with pain Reflux study today was performed.  The great saphenous vein remains ablated/stripped after her previous interventions.  She does have prominent varicosities present in the distribution as described above.  She also has deep venous reflux present.  She has diligently worn compression socks and elevated her legs and has had venous treatments on multiple occasions over many years.  She takes anti-inflammatories for discomfort.  Despite appropriate conservative therapy for years, she continues to have significant symptoms of painful varicosities on the right leg.  As such, foam sclerotherapy would be appropriate for treatment of these large symptomatic varicosities on the right leg.  I have discussed the risks and benefits and she  desires to proceed.      Festus Barren 10/02/2022, 11:40 AM   This note was created with Dragon medical transcription system.  Any errors from dictation are unintentional.

## 2022-10-03 ENCOUNTER — Encounter: Payer: Self-pay | Admitting: Internal Medicine

## 2022-10-03 ENCOUNTER — Telehealth (INDEPENDENT_AMBULATORY_CARE_PROVIDER_SITE_OTHER): Payer: Self-pay

## 2022-10-03 ENCOUNTER — Ambulatory Visit (INDEPENDENT_AMBULATORY_CARE_PROVIDER_SITE_OTHER): Payer: 59 | Admitting: Internal Medicine

## 2022-10-03 VITALS — BP 110/78 | HR 67 | Temp 97.6°F | Ht 66.0 in | Wt 130.2 lb

## 2022-10-03 DIAGNOSIS — Z0000 Encounter for general adult medical examination without abnormal findings: Secondary | ICD-10-CM | POA: Diagnosis not present

## 2022-10-03 DIAGNOSIS — E782 Mixed hyperlipidemia: Secondary | ICD-10-CM

## 2022-10-03 DIAGNOSIS — H9313 Tinnitus, bilateral: Secondary | ICD-10-CM

## 2022-10-03 DIAGNOSIS — Z1231 Encounter for screening mammogram for malignant neoplasm of breast: Secondary | ICD-10-CM | POA: Diagnosis not present

## 2022-10-03 DIAGNOSIS — H918X2 Other specified hearing loss, left ear: Secondary | ICD-10-CM

## 2022-10-03 DIAGNOSIS — R7301 Impaired fasting glucose: Secondary | ICD-10-CM

## 2022-10-03 LAB — COMPREHENSIVE METABOLIC PANEL
ALT: 11 U/L (ref 0–35)
AST: 16 U/L (ref 0–37)
Albumin: 4.6 g/dL (ref 3.5–5.2)
Alkaline Phosphatase: 43 U/L (ref 39–117)
BUN: 9 mg/dL (ref 6–23)
CO2: 30 mEq/L (ref 19–32)
Calcium: 9.6 mg/dL (ref 8.4–10.5)
Chloride: 103 mEq/L (ref 96–112)
Creatinine, Ser: 0.84 mg/dL (ref 0.40–1.20)
GFR: 80.09 mL/min (ref >60.00)
Glucose, Bld: 84 mg/dL (ref 70–99)
Potassium: 4.3 mEq/L (ref 3.5–5.1)
Sodium: 138 mEq/L (ref 135–145)
Total Bilirubin: 0.5 mg/dL (ref 0.2–1.2)
Total Protein: 6.8 g/dL (ref 6.0–8.3)

## 2022-10-03 LAB — HEMOGLOBIN A1C: Hgb A1c MFr Bld: 5.6 % (ref 4.6–6.5)

## 2022-10-03 NOTE — Progress Notes (Addendum)
Patient ID: Virginia Maldonado, female    DOB: Jul 06, 1970  Age: 52 y.o. MRN: [id number]  The patient is here for annual preventive examination and management of other chronic and acute problems.   The risk factors are reflected in the social history.   The roster of all physicians providing medical care to patient - is listed in the Snapshot section of the chart.   Activities of daily living:  The patient is 100% independent in all ADLs: dressing, toileting, feeding as well as independent mobility   Home safety : The patient has smoke detectors in the home. They wear seatbelts.  There are no unsecured firearms at home. There is no violence in the home.    There is no risks for hepatitis, STDs or HIV. There is no   history of blood transfusion. They have no travel history to infectious disease endemic areas of the world.   The patient has seen their dentist in the last six month. They have seen their eye doctor in the last year. The patinet  denies slight hearing difficulty with regard to whispered voices and some television programs.  They have deferred audiologic testing in the last year.  They do not  have excessive sun exposure. Discussed the need for sun protection: hats, long sleeves and use of sunscreen if there is significant sun exposure.    Diet: the importance of a healthy diet is discussed. They do have a healthy diet.   The benefits of regular aerobic exercise were discussed. The patient  exercises  3 to 5 days per week  for  60 minutes.    Depression screen: there are no signs or vegative symptoms of depression- irritability, change in appetite, anhedonia, sadness/tearfullness.   The following portions of the patient's history were reviewed and updated as appropriate: allergies, current medications, past family history, past medical history,  past surgical history, past social history  and problem list.   Visual acuity was not assessed per patient preference since the patient has regular  follow up with an  ophthalmologist. Hearing and body mass index were assessed and reviewed.    During the course of the visit the patient was educated and counseled about appropriate screening and preventive services including : fall prevention , diabetes screening, nutrition counseling, colorectal cancer screening, and recommended immunizations.    Chief Complaint:  1) Left ear fullness,  altered hearing.   Has been oroblematic since her COVID VACCINATION (2020 ) , recentlythe tinnitus has  resolved, but not the feeling of fullness and  hearing loss in the left ear   2) VARICOSE VEINS;  saw Dew yesterday.  Awaiting insurance authorization for procedure.   3) Hyperlipidemia:  has reduced red meat , increased veggies.  FH of HLD. Normal stress test 2018   4) bilateral foot pain : improved with inserts. Seeing podiatry,  surgery deferred ,  did not tolerate the injection   Review of Symptoms  Patient denies headache, fevers, malaise, unintentional weight loss, skin rash, eye pain, sinus congestion and sinus pain, sore throat, dysphagia,  hemoptysis , cough, dyspnea, wheezing, chest pain, palpitations, orthopnea, edema, abdominal pain, nausea, melena, diarrhea, constipation, flank pain, dysuria, hematuria, urinary  Frequency, nocturia, numbness, tingling, seizures,  Focal weakness, Loss of consciousness,  Tremor, insomnia, depression, anxiety, and suicidal ideation.    Physical Exam:  BP 110/78   Pulse 67   Temp 97.6 F (36.4 C) (Oral)   Ht 5\' 6"  (1.676 m)   Wt 130 lb 3.2  oz (59.1 kg)   SpO2 97%   BMI 21.01 kg/m    Physical Exam Vitals reviewed.  Constitutional:      General: She is not in acute distress.    Appearance: Normal appearance. She is well-developed and normal weight. She is not ill-appearing, toxic-appearing or diaphoretic.  HENT:     Head: Normocephalic.     Right Ear: Tympanic membrane, ear canal and external ear normal. There is no impacted cerumen.     Left Ear:  Tympanic membrane, ear canal and external ear normal. Decreased hearing noted. There is no impacted cerumen.     Nose: Nose normal.     Mouth/Throat:     Mouth: Mucous membranes are moist.     Pharynx: Oropharynx is clear.  Eyes:     General: No scleral icterus.       Right eye: No discharge.        Left eye: No discharge.     Conjunctiva/sclera: Conjunctivae normal.     Pupils: Pupils are equal, round, and reactive to light.  Neck:     Thyroid: No thyromegaly.     Vascular: No carotid bruit or JVD.  Cardiovascular:     Rate and Rhythm: Normal rate and regular rhythm.     Heart sounds: Normal heart sounds.  Pulmonary:     Effort: Pulmonary effort is normal. No respiratory distress.     Breath sounds: Normal breath sounds.  Chest:  Breasts:    Breasts are symmetrical.     Right: Normal. No swelling, inverted nipple, mass, nipple discharge, skin change or tenderness.     Left: Normal. No swelling, inverted nipple, mass, nipple discharge, skin change or tenderness.  Abdominal:     General: Bowel sounds are normal.     Palpations: Abdomen is soft. There is no mass.     Tenderness: There is no abdominal tenderness. There is no guarding or rebound.  Musculoskeletal:        General: Normal range of motion.     Cervical back: Normal range of motion and neck supple.  Lymphadenopathy:     Cervical: No cervical adenopathy.     Upper Body:     Right upper body: No supraclavicular, axillary or pectoral adenopathy.     Left upper body: No supraclavicular, axillary or pectoral adenopathy.  Skin:    General: Skin is warm and dry.  Neurological:     General: No focal deficit present.     Mental Status: She is alert and oriented to person, place, and time. Mental status is at baseline.  Psychiatric:        Mood and Affect: Mood normal.        Behavior: Behavior normal.        Thought Content: Thought content normal.        Judgment: Judgment normal.     Assessment and Plan: Encounter  for screening mammogram for malignant neoplasm of breast -     3D Screening Mammogram, Left and Right; Future  Encounter for preventive health examination Assessment & Plan: age appropriate education and counseling updated, referrals for preventative services and immunizations addressed, dietary and smoking counseling addressed, most recent labs reviewed.  I have personally reviewed and have noted:   1) the patient's medical and social history 2) The pt's use of alcohol, tobacco, and illicit drugs 3) The patient's current medications and supplements 4) Functional ability including ADL's, fall risk, home safety risk, hearing and visual impairment 5) Diet and physical  activities 6) Evidence for depression or mood disorder 7) The patient's height, weight, and BMI have been recorded in the chart 8) breast, cervical and colon cancer screening reviewed (PAP normal 2021  but currently menstruating) 9) Using the AHA cardiac risk calculator,  her current risk of CAD is < 2% .    I have made referrals, and provided counseling and education based on review of the above    Breast cancer screening by mammogram  Other specified hearing loss of left ear, unspecified hearing status on contralateral side -     Ambulatory referral to Audiology  Moderate mixed hyperlipidemia not requiring statin therapy -     Lipid Panel w/reflex Direct LDL -     Comprehensive metabolic panel  Impaired fasting glucose -     Hemoglobin A1c  Tinnitus aurium, bilateral Assessment & Plan: Occurred after receiving COVID vaccination in 2020.Marland Kitchen  Finally resolved but now with suspicion of sensorineural hearing loss.  Referral to audilology has been made      No follow-ups on file.  Sherlene Shams, MD

## 2022-10-03 NOTE — Assessment & Plan Note (Addendum)
age appropriate education and counseling updated, referrals for preventative services and immunizations addressed, dietary and smoking counseling addressed, most recent labs reviewed.  I have personally reviewed and have noted:   1) the patient's medical and social history 2) The pt's use of alcohol, tobacco, and illicit drugs 3) The patient's current medications and supplements 4) Functional ability including ADL's, fall risk, home safety risk, hearing and visual impairment 5) Diet and physical activities 6) Evidence for depression or mood disorder 7) The patient's height, weight, and BMI have been recorded in the chart 8) breast, cervical and colon cancer screening reviewed (PAP normal 2021  but currently menstruating) 9) Using the AHA cardiac risk calculator,  her current risk of CAD is < 2% .    I have made referrals, and provided counseling and education based on review of the above

## 2022-10-03 NOTE — Telephone Encounter (Signed)
LVM for pt to call back to the office to schedule foam sclero with dr Wyn Quaker no PA req

## 2022-10-03 NOTE — Patient Instructions (Signed)
Referral has been made to [facility] for hearing test  Your annual mammogram has been ordered .  [place] Maldonado will not allow [place] to schedule it for you,  so please  call to make your appointment [phone]

## 2022-10-03 NOTE — Assessment & Plan Note (Signed)
Occurred after receiving COVID vaccination in 2020.Marland Kitchen  Finally resolved but now with suspicion of sensorineural hearing loss.  Referral to audilology has been made

## 2022-10-04 ENCOUNTER — Telehealth (INDEPENDENT_AMBULATORY_CARE_PROVIDER_SITE_OTHER): Payer: Self-pay

## 2022-10-04 LAB — LIPID PANEL W/REFLEX DIRECT LDL
Cholesterol: 259 mg/dL — ABNORMAL HIGH (ref <200)
HDL: 61 mg/dL (ref >=50)
LDL Cholesterol (Calc): 174 mg/dL — ABNORMAL HIGH
Non-HDL Cholesterol (Calc): 198 mg/dL — ABNORMAL HIGH (ref <130)
Total CHOL/HDL Ratio: 4.2 (calc) (ref <5.0)
Triglycerides: 109 mg/dL (ref <150)

## 2022-10-04 NOTE — Telephone Encounter (Signed)
LVM for pt to call office to get sclero scheduled with Dr Gilda Crease

## 2022-10-17 ENCOUNTER — Ambulatory Visit
Admission: RE | Admit: 2022-10-17 | Discharge: 2022-10-17 | Disposition: A | Discharge status: Home / Self Care | Payer: 59 | Source: Ambulatory Visit | Attending: Internal Medicine | Admitting: Internal Medicine

## 2022-10-17 DIAGNOSIS — Z1231 Encounter for screening mammogram for malignant neoplasm of breast: Secondary | ICD-10-CM | POA: Insufficient documentation

## 2022-11-02 ENCOUNTER — Telehealth (INDEPENDENT_AMBULATORY_CARE_PROVIDER_SITE_OTHER): Payer: Self-pay

## 2022-11-02 NOTE — Telephone Encounter (Signed)
Tried to call pt to schedule foam sclero appts no answer LVM for pt to call office back to schedule

## 2022-11-15 ENCOUNTER — Telehealth (INDEPENDENT_AMBULATORY_CARE_PROVIDER_SITE_OTHER): Payer: Self-pay

## 2022-11-15 NOTE — Telephone Encounter (Signed)
LVM for pt to call back to get sclero appts scheduled

## 2022-12-20 DIAGNOSIS — H6982 Other specified disorders of Eustachian tube, left ear: Secondary | ICD-10-CM | POA: Diagnosis not present

## 2022-12-20 DIAGNOSIS — J301 Allergic rhinitis due to pollen: Secondary | ICD-10-CM | POA: Diagnosis not present

## 2022-12-20 DIAGNOSIS — H6983 Other specified disorders of Eustachian tube, bilateral: Secondary | ICD-10-CM | POA: Diagnosis not present

## 2022-12-20 DIAGNOSIS — H93293 Other abnormal auditory perceptions, bilateral: Secondary | ICD-10-CM | POA: Diagnosis not present

## 2023-07-09 ENCOUNTER — Encounter (INDEPENDENT_AMBULATORY_CARE_PROVIDER_SITE_OTHER): Payer: Self-pay

## 2023-10-14 ENCOUNTER — Encounter: Payer: 59 | Admitting: Internal Medicine

## 2023-12-27 ENCOUNTER — Ambulatory Visit: Admitting: Internal Medicine

## 2023-12-27 ENCOUNTER — Encounter: Payer: Self-pay | Admitting: Internal Medicine

## 2023-12-27 VITALS — BP 114/70 | HR 69 | Temp 97.6°F | Ht 66.0 in | Wt 136.0 lb

## 2023-12-27 DIAGNOSIS — Z1211 Encounter for screening for malignant neoplasm of colon: Secondary | ICD-10-CM | POA: Diagnosis not present

## 2023-12-27 DIAGNOSIS — H6993 Unspecified Eustachian tube disorder, bilateral: Secondary | ICD-10-CM | POA: Diagnosis not present

## 2023-12-27 DIAGNOSIS — Z0000 Encounter for general adult medical examination without abnormal findings: Secondary | ICD-10-CM | POA: Diagnosis not present

## 2023-12-27 DIAGNOSIS — I83813 Varicose veins of bilateral lower extremities with pain: Secondary | ICD-10-CM

## 2023-12-27 DIAGNOSIS — R5383 Other fatigue: Secondary | ICD-10-CM

## 2023-12-27 DIAGNOSIS — E782 Mixed hyperlipidemia: Secondary | ICD-10-CM

## 2023-12-27 DIAGNOSIS — Z1231 Encounter for screening mammogram for malignant neoplasm of breast: Secondary | ICD-10-CM

## 2023-12-27 DIAGNOSIS — G2581 Restless legs syndrome: Secondary | ICD-10-CM

## 2023-12-27 DIAGNOSIS — R7301 Impaired fasting glucose: Secondary | ICD-10-CM

## 2023-12-27 LAB — COMPREHENSIVE METABOLIC PANEL WITH GFR
ALT: 10 U/L (ref 0–35)
AST: 17 U/L (ref 0–37)
Albumin: 4.5 g/dL (ref 3.5–5.2)
Alkaline Phosphatase: 43 U/L (ref 39–117)
BUN: 11 mg/dL (ref 6–23)
CO2: 27 meq/L (ref 19–32)
Calcium: 9 mg/dL (ref 8.4–10.5)
Chloride: 102 meq/L (ref 96–112)
Creatinine, Ser: 0.74 mg/dL (ref 0.40–1.20)
GFR: 92.44 mL/min (ref >60.00)
Glucose, Bld: 80 mg/dL (ref 70–99)
Potassium: 3.9 meq/L (ref 3.5–5.1)
Sodium: 139 meq/L (ref 135–145)
Total Bilirubin: 0.5 mg/dL (ref 0.2–1.2)
Total Protein: 6.8 g/dL (ref 6.0–8.3)

## 2023-12-27 LAB — CBC WITH DIFFERENTIAL/PLATELET
Basophils Absolute: 0.1 K/uL (ref 0.0–0.1)
Basophils Relative: 1.4 % (ref 0.0–3.0)
Eosinophils Absolute: 0.3 K/uL (ref 0.0–0.7)
Eosinophils Relative: 4.4 % (ref 0.0–5.0)
HCT: 36.3 % (ref 36.0–46.0)
Hemoglobin: 12.1 g/dL (ref 12.0–15.0)
Lymphocytes Relative: 30.6 % (ref 12.0–46.0)
Lymphs Abs: 1.8 K/uL (ref 0.7–4.0)
MCHC: 33.4 g/dL (ref 30.0–36.0)
MCV: 94.5 fl (ref 78.0–100.0)
Monocytes Absolute: 0.4 K/uL (ref 0.1–1.0)
Monocytes Relative: 6.7 % (ref 3.0–12.0)
Neutro Abs: 3.3 K/uL (ref 1.4–7.7)
Neutrophils Relative %: 56.9 % (ref 43.0–77.0)
Platelets: 272 K/uL (ref 150.0–400.0)
RBC: 3.84 Mil/uL — ABNORMAL LOW (ref 3.87–5.11)
RDW: 13.6 % (ref 11.5–15.5)
WBC: 5.9 K/uL (ref 4.0–10.5)

## 2023-12-27 LAB — LIPID PANEL
Cholesterol: 206 mg/dL — ABNORMAL HIGH (ref 0–200)
HDL: 62.4 mg/dL (ref >39.00)
LDL Cholesterol: 125 mg/dL — ABNORMAL HIGH (ref 0–99)
NonHDL: 143.87
Total CHOL/HDL Ratio: 3
Triglycerides: 96 mg/dL (ref 0.0–149.0)
VLDL: 19.2 mg/dL (ref 0.0–40.0)

## 2023-12-27 LAB — IBC + FERRITIN
Ferritin: 116.2 ng/mL (ref 10.0–291.0)
Iron: 84 ug/dL (ref 42–145)
Saturation Ratios: 25.5 % (ref 20.0–50.0)
TIBC: 329 ug/dL (ref 250.0–450.0)
Transferrin: 235 mg/dL (ref 212.0–360.0)

## 2023-12-27 LAB — TSH: TSH: 2.44 u[IU]/mL (ref 0.35–5.50)

## 2023-12-27 LAB — LDL CHOLESTEROL, DIRECT: Direct LDL: 137 mg/dL

## 2023-12-27 NOTE — Progress Notes (Signed)
 Patient ID: Virginia Maldonado, female    DOB: 31-Jan-1971  Age: 53 y.o. MRN: [id number]  The patient is here for annual preventive examination and management of other chronic and acute problems.   The risk factors are reflected in the social history.   The roster of all physicians providing medical care to patient - is listed in the Snapshot section of the chart.   Activities of daily living:  The patient is 100% independent in all ADLs: dressing, toileting, feeding as well as independent mobility   Home safety : The patient has smoke detectors in the home. They wear seatbelts.  There are no unsecured firearms at home. There is no violence in the home.    There is no risks for hepatitis, STDs or HIV. There is no   history of blood transfusion. They have no travel history to infectious disease endemic areas of the world.   The patient has seen their dentist in the last six month. They have seen their eye doctor in the last year. The patinet  denies slight hearing difficulty with regard to whispered voices and some television programs.  They have deferred audiologic testing in the last year.  They do not  have excessive sun exposure. Discussed the need for sun protection: hats, long sleeves and use of sunscreen if there is significant sun exposure.    Diet: the importance of a healthy diet is discussed. They do have a healthy diet.   The benefits of regular aerobic exercise were discussed. The patient  exercises  3 to 5 days per week  for  60 minutes.    Depression screen: there are no signs or vegative symptoms of depression- irritability, change in appetite, anhedonia, sadness/tearfullness.   The following portions of the patient's history were reviewed and updated as appropriate: allergies, current medications, past family history, past medical history,  past surgical history, past social history  and problem list.   Visual acuity was not assessed per patient preference since the patient has regular  follow up with an  ophthalmologist. Hearing and body mass index were assessed and reviewed.    During the course of the visit the patient was educated and counseled about appropriate screening and preventive services including : fall prevention , diabetes screening, nutrition counseling, colorectal cancer screening, and recommended immunizations.    Chief Complaint:  restless legs.  Start in the evening.  Varicose veins . Reviewed prir treatments and evaluation    Review of Symptoms  Patient denies headache, fevers, malaise, unintentional weight loss, skin rash, eye pain, sinus congestion and sinus pain, sore throat, dysphagia,  hemoptysis , cough, dyspnea, wheezing, chest pain, palpitations, orthopnea, edema, abdominal pain, nausea, melena, diarrhea, constipation, flank pain, dysuria, hematuria, urinary  Frequency, nocturia, numbness, tingling, seizures,  Focal weakness, Loss of consciousness,  Tremor, insomnia, depression, anxiety, and suicidal ideation.    Physical Exam:  BP 114/70   Pulse 69   Temp 97.6 F (36.4 C) (Oral)   Ht 5' 6 (1.676 m)   Wt 136 lb (61.7 kg)   SpO2 99%   BMI 21.95 kg/m    Physical Exam Vitals reviewed.  Constitutional:      General: She is not in acute distress.    Appearance: Normal appearance. She is well-developed and normal weight. She is not ill-appearing, toxic-appearing or diaphoretic.  HENT:     Head: Normocephalic.     Right Ear: Tympanic membrane, ear canal and external ear normal. There is no impacted cerumen.  Left Ear: Tympanic membrane, ear canal and external ear normal. There is no impacted cerumen.     Nose: Nose normal.     Mouth/Throat:     Mouth: Mucous membranes are moist.     Pharynx: Oropharynx is clear.  Eyes:     General: No scleral icterus.       Right eye: No discharge.        Left eye: No discharge.     Conjunctiva/sclera: Conjunctivae normal.     Pupils: Pupils are equal, round, and reactive to light.  Neck:      Thyroid : No thyromegaly.     Vascular: No carotid bruit or JVD.  Cardiovascular:     Rate and Rhythm: Normal rate and regular rhythm.     Heart sounds: Normal heart sounds.  Pulmonary:     Effort: Pulmonary effort is normal. No respiratory distress.     Breath sounds: Normal breath sounds.  Chest:  Breasts:    Breasts are symmetrical.     Right: Normal. No swelling, inverted nipple, mass, nipple discharge, skin change or tenderness.     Left: Normal. No swelling, inverted nipple, mass, nipple discharge, skin change or tenderness.  Abdominal:     General: Bowel sounds are normal.     Palpations: Abdomen is soft. There is no mass.     Tenderness: There is no abdominal tenderness. There is no guarding or rebound.  Musculoskeletal:        General: Normal range of motion.     Cervical back: Normal range of motion and neck supple.  Lymphadenopathy:     Cervical: No cervical adenopathy.     Upper Body:     Right upper body: No supraclavicular, axillary or pectoral adenopathy.     Left upper body: No supraclavicular, axillary or pectoral adenopathy.  Skin:    General: Skin is warm and dry.  Neurological:     General: No focal deficit present.     Mental Status: She is alert and oriented to person, place, and time. Mental status is at baseline.  Psychiatric:        Mood and Affect: Mood normal.        Behavior: Behavior normal.        Thought Content: Thought content normal.        Judgment: Judgment normal.     Assessment and Plan: Encounter for preventive health examination Assessment & Plan: age appropriate education and counseling updated, referrals for preventative services and immunizations addressed, dietary and smoking counseling addressed, most recent labs reviewed.  I have personally reviewed and have noted:   1) the patient's medical and social history 2) The pt's use of alcohol, tobacco, and illicit drugs 3) The patient's current medications and supplements 4)  Functional ability including ADL's, fall risk, home safety risk, hearing and visual impairment 5) Diet and physical activities 6) Evidence for depression or mood disorder 7) The patient's height, weight, and BMI have been recorded in the chart  I have made referrals, and provided counseling and education based on review of the above    Encounter for screening mammogram for malignant neoplasm of breast -     3D Screening Mammogram, Left and Right; Future  Encounter for preventative adult health care examination  Impaired fasting glucose -     Comprehensive metabolic panel with GFR  Moderate mixed hyperlipidemia not requiring statin therapy -     Lipid panel -     LDL cholesterol, direct  Other  fatigue -     CBC with Differential/Platelet -     TSH  Screening for colon cancer -     Ambulatory referral to Gastroenterology  Restless legs -     IBC + Ferritin  Varicose veins of both lower extremities with pain Assessment & Plan: With prior ablation /excision  of right popliteal vein by Dr.  Dellie following a pregnancy induced DVT in 2008.  Has seen  Dr Marea at [facility].  But has deferred the offered surgery  she has resumed daily use of compression tights    Eustachian tube dysfunction, bilateral Assessment & Plan: Diagnosed Oct 2024 during ENT evaluation for decreased hearing, chronic congestion.  Hearig test normal.    Restless legs syndrome Assessment & Plan: Iron studies are normal.  Treatment offered   Lab Results  Component Value Date   IRON 84 12/27/2023   TIBC 329.0 12/27/2023   FERRITIN 116.2 12/27/2023        No follow-ups on file.  Verneita LITTIE Kettering, MD

## 2023-12-27 NOTE — Patient Instructions (Addendum)
 Try using Aspercreme  on your saphenous vein  for comfort (topical lidocaine)  Referral to Dr Therisa at [facility] in progress;;  will change to [facility] if he is not in network   Consider getting the pneumonia vaccine now that you are > 50

## 2023-12-28 DIAGNOSIS — H6993 Unspecified Eustachian tube disorder, bilateral: Secondary | ICD-10-CM | POA: Insufficient documentation

## 2023-12-28 NOTE — Assessment & Plan Note (Signed)
 With prior ablation /excision  of right popliteal vein by Dr.  Dellie following a pregnancy induced DVT in 2008.  Has seen  Dr Marea at [facility].  But has deferred the offered surgery  she has resumed daily use of compression tights

## 2023-12-28 NOTE — Assessment & Plan Note (Signed)
 Diagnosed Oct 2024 during ENT evaluation for decreased hearing, chronic congestion.  Hearig test normal.

## 2023-12-28 NOTE — Assessment & Plan Note (Signed)

## 2023-12-29 ENCOUNTER — Ambulatory Visit: Payer: Self-pay | Admitting: Internal Medicine

## 2023-12-29 DIAGNOSIS — G2581 Restless legs syndrome: Secondary | ICD-10-CM | POA: Insufficient documentation

## 2023-12-29 NOTE — Addendum Note (Signed)
 Addended by: MARYLYNN VERNEITA CROME on: 12/29/2023 07:36 PM   Modules accepted: Level of Service

## 2023-12-29 NOTE — Assessment & Plan Note (Signed)
 Iron studies are normal.  Treatment offered   Lab Results  Component Value Date   IRON 84 12/27/2023   TIBC 329.0 12/27/2023   FERRITIN 116.2 12/27/2023

## 2024-03-24 NOTE — Progress Notes (Signed)
 Virginia Maldonado                                          MRN: [id number]   03/24/2024   The [facility] Quality Team Specialist reviewed this patient medical record for the purposes of chart review for care gap closure. The following were reviewed: chart review for care gap closure-colorectal cancer screening for 2025. Pt scheduled for col 04/03/2024.    [facility] Quality Team

## 2024-04-03 ENCOUNTER — Ambulatory Visit: Admission: RE | Admit: 2024-04-03 | Source: Home / Self Care | Admitting: Gastroenterology

## 2024-04-03 ENCOUNTER — Encounter: Admission: RE | Payer: Self-pay | Source: Home / Self Care

## 2025-01-01 ENCOUNTER — Encounter: Admitting: Internal Medicine
